# Patient Record
Sex: Female | Born: 1965 | Race: White | Hispanic: No | Marital: Married | Smoking: Never smoker
Health system: Southern US, Community
[De-identification: ages and names within clinical notes are randomized; demographics above are authoritative.]

## PROBLEM LIST (undated history)

## (undated) DIAGNOSIS — F329 Major depressive disorder, single episode, unspecified: Secondary | ICD-10-CM

## (undated) DIAGNOSIS — F32A Depression, unspecified: Secondary | ICD-10-CM

## (undated) DIAGNOSIS — E079 Disorder of thyroid, unspecified: Secondary | ICD-10-CM

## (undated) DIAGNOSIS — F419 Anxiety disorder, unspecified: Secondary | ICD-10-CM

## (undated) HISTORY — PX: PLACEMENT OF BREAST IMPLANTS: SHX6334

---
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
## 2017-01-24 ENCOUNTER — Encounter: Payer: Self-pay | Admitting: *Deleted

## 2017-01-24 ENCOUNTER — Ambulatory Visit
Admission: EM | Admit: 2017-01-24 | Discharge: 2017-01-24 | Disposition: A | Discharge status: Home / Self Care | Payer: BLUE CROSS/BLUE SHIELD | Attending: Family Medicine | Admitting: Family Medicine

## 2017-01-24 DIAGNOSIS — R05 Cough: Secondary | ICD-10-CM

## 2017-01-24 DIAGNOSIS — R059 Cough, unspecified: Secondary | ICD-10-CM

## 2017-01-24 DIAGNOSIS — J069 Acute upper respiratory infection, unspecified: Secondary | ICD-10-CM

## 2017-01-24 HISTORY — DX: Disorder of thyroid, unspecified: E07.9

## 2017-01-24 LAB — RAPID STREP SCREEN (MED CTR MEBANE ONLY): Streptococcus, Group A Screen (Direct): NEGATIVE

## 2017-01-24 MED ORDER — PREDNISONE 20 MG PO TABS: ORAL_TABLET | ORAL | 0 refills | Status: DC

## 2017-01-24 MED ORDER — HYDROCOD POLST-CPM POLST ER 10-8 MG/5ML PO SUER: 5.0000 mL | Freq: Two times a day (BID) | ORAL | 0 refills | Status: DC

## 2017-01-24 MED ORDER — BENZONATATE 200 MG PO CAPS: ORAL_CAPSULE | ORAL | 0 refills | Status: DC

## 2017-01-24 NOTE — ED Triage Notes (Signed)
Sore throat and non-productive cough x1 week. Denies other symptoms.

## 2017-01-24 NOTE — ED Provider Notes (Signed)
CSN: [id number]     Arrival date & time 01/24/17  1111 History   First MD Initiated Contact with Patient 01/24/17 1148     Chief Complaint  Patient presents with  . Sore Throat  . 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 Denies any nasal discharge has had no fever or chills. She's a nonsmoker. She is a traveling nurse in surgery. He first noticed a sore throat after she was trimming trees around her yard. At first she thought it was allergies but has persisted and worsened.      Past Medical History:  Diagnosis Date  . Thyroid disease    History reviewed. No pertinent surgical history. Family History  Problem Relation Age of Onset  . Alzheimer's disease Mother   . Healthy Father    Social History  Substance Use Topics  . Smoking status: Never Smoker  . Smokeless tobacco: Never Used  . Alcohol use Yes   OB History    No data available     Review of Systems  Constitutional: Negative for activity change, appetite change, chills, fatigue and fever.  HENT: Positive for sore throat.   Respiratory: Positive for cough. Negative for shortness of breath and stridor.   All other systems reviewed and are negative.   Allergies  Patient has no known allergies.  Home Medications   Prior to Admission medications   Medication Sig Start Date End Date Taking? Authorizing Provider  cyanocobalamin 100 MCG tablet Take 100 mcg by mouth daily.   Yes [provider]  escitalopram (LEXAPRO) 20 MG tablet Take 20 mg by mouth daily.   Yes [provider]  progesterone (PROMETRIUM) 100 MG capsule Take 100 mg by mouth daily.   Yes [provider]  thyroid (ARMOUR THYROID) 15 MG tablet Take 15 mg by mouth daily.   Yes [provider]  zolpidem (AMBIEN) 10 MG tablet Take 10 mg by mouth at bedtime  as needed for sleep.   Yes [provider]  benzonatate (TESSALON) 200 MG capsule Take one cap TID PRN cough 01/24/17   Lutricia Feiloemer, William P, PA-C  chlorpheniramine-HYDROcodone [facility](TUSSIONEX PENNKINETIC ER) 10-8 MG/5ML SUER Take 5 mLs by mouth 2 (two) times daily. 01/24/17   Lutricia Feiloemer, William P, PA-C  predniSONE (DELTASONE) 20 MG tablet Take 2 tablets (40 mg) daily by mouth 01/24/17   Lutricia Feiloemer, William P, PA-C   Meds Ordered and Administered this Visit  Medications - No data to display  BP 118/80 (BP Location: Left Arm)   Pulse 80   Temp 98.3 F (36.8 C) (Oral)   Resp 16   Ht 5\' 2"  (1.575 m)   Wt 127 lb (57.6 kg)   LMP  (Exact Date)   SpO2 99%   BMI 23.23 kg/m  No data found.   Physical Exam  Constitutional: She is oriented to person, place, and time. She appears well-developed and well-nourished. No distress.  HENT:  Head: Normocephalic.  Right Ear: External ear normal.  Left Ear: External ear normal.  Nose: Nose normal.  Mouth/Throat: Oropharynx is clear and moist. No oropharyngeal exudate.  Eyes: Pupils are equal, round, and reactive to light. Right eye exhibits no discharge. Left eye exhibits no discharge.  Neck: Normal range of motion.  Pulmonary/Chest: Effort normal and breath sounds normal.  Musculoskeletal: Normal range of motion.  Lymphadenopathy:    She has no cervical adenopathy.  Neurological: She is alert and oriented to person, place, and time.  Skin: Skin is warm and dry. She is not diaphoretic.  Psychiatric: She has a normal mood and affect. Her behavior is normal. Judgment and thought content normal.  Nursing note and vitals reviewed.   Urgent Care Course     Procedures (including critical care time)  Labs Review Labs Reviewed  RAPID STREP SCREEN (NOT AT [facility])  CULTURE, GROUP A STREP [facility])    Imaging Review No results found.   Visual Acuity Review  Right Eye Distance:   Left Eye Distance:   Bilateral Distance:    Right Eye Near:   Left Eye  Near:    Bilateral Near:         MDM   1. Cough   2. Upper respiratory tract infection, unspecified type    Discharge Medication List as of 01/24/2017 12:15 PM    START taking these medications   Details  benzonatate (TESSALON) 200 MG capsule Take one cap TID PRN cough, Normal    chlorpheniramine-HYDROcodone (TUSSIONEX PENNKINETIC ER) 10-8 MG/5ML SUER Take 5 mLs by mouth 2 (two) times daily., Starting Thu 01/24/2017, Print    predniSONE (DELTASONE) 20 MG tablet Take 2 tablets (40 mg) daily by mouth, Normal      Plan: 1. Test/x-ray results and diagnosis reviewed with patient 2. rx as per orders; risks, benefits, potential side effects reviewed with patient 3. Recommend supportive treatment with Rest and fluids. Use warm salt water gargles and lozenges for comfort. Through this is likely a viral illness and does not require antibiotic. Throat cultures will be available in 48 hours and if they do reveal a strep infection will be treated appropriately. She is worsening or not improving should follow-up with her primary care physician. 4. F/u prn if symptoms worsen or don't improve     Lutricia Feil, PA-C 01/24/17 1224

## 2017-01-26 LAB — CULTURE, GROUP A STREP (THRC)

## 2017-10-07 DIAGNOSIS — Z0000 Encounter for general adult medical examination without abnormal findings: Secondary | ICD-10-CM | POA: Diagnosis not present

## 2017-10-07 DIAGNOSIS — Z0001 Encounter for general adult medical examination with abnormal findings: Secondary | ICD-10-CM | POA: Diagnosis not present

## 2017-10-07 MED FILL — ZOLPIDEM TARTRATE 10 MG TAB: 10 | 90 days supply | Qty: 90 | Fill #0

## 2018-01-03 MED FILL — ZOLPIDEM TARTRATE 10 MG TAB: 10 | 90 days supply | Qty: 90 | Fill #1

## 2018-08-29 ENCOUNTER — Encounter (HOSPITAL_COMMUNITY): Payer: Self-pay | Admitting: Emergency Medicine

## 2018-08-29 ENCOUNTER — Emergency Department (HOSPITAL_COMMUNITY): Payer: BLUE CROSS/BLUE SHIELD

## 2018-08-29 ENCOUNTER — Emergency Department (HOSPITAL_COMMUNITY)
Admission: EM | Admit: 2018-08-29 | Discharge: 2018-08-29 | Disposition: A | Discharge status: Home / Self Care | Payer: BLUE CROSS/BLUE SHIELD | Attending: Emergency Medicine | Admitting: Emergency Medicine

## 2018-08-29 DIAGNOSIS — Z79899 Other long term (current) drug therapy: Secondary | ICD-10-CM | POA: Diagnosis not present

## 2018-08-29 DIAGNOSIS — R05 Cough: Secondary | ICD-10-CM | POA: Diagnosis present

## 2018-08-29 DIAGNOSIS — J111 Influenza due to unidentified influenza virus with other respiratory manifestations: Secondary | ICD-10-CM | POA: Insufficient documentation

## 2018-08-29 DIAGNOSIS — R69 Illness, unspecified: Secondary | ICD-10-CM

## 2018-08-29 MED ORDER — HYDROCODONE-HOMATROPINE 5-1.5 MG/5ML PO SYRP: 5.0000 mL | ORAL_SOLUTION | Freq: Four times a day (QID) | ORAL | 0 refills | Status: DC | PRN

## 2018-08-29 NOTE — ED Triage Notes (Signed)
Pt to ER for evaluation of flu like symptoms - cough (nonproductive), reports body aches, sore throat. Also reports generalized body aches. Reports no BM in 4 days. Reports sharp chest pain as well. A/o x4.

## 2018-08-29 NOTE — Discharge Instructions (Addendum)
Return if any problems.

## 2018-08-29 NOTE — ED Provider Notes (Signed)
[facility]ENC[facility] Provider Note   CSN: [id number] Arrival date & time: 08/29/18  1139     History   Chief Complaint Chief Complaint  Patient presents with  . Influenza    HPI Ninetta LightsBeverly Hussar is a 53 y.o. female.  The history is provided by the patient.  Cough  Cough characteristics:  Productive Sputum characteristics:  Green Severity:  Moderate Onset quality:  Gradual Duration:  4 days Timing:  Constant Progression:  Worsening Chronicity:  New Context: upper respiratory infection   Relieved by:  Nothing Worsened by:  Nothing Ineffective treatments:  None tried Associated symptoms: no chest pain and no shortness of breath   Pt complains of abdominal pain from coughing.  Pt reports she she started taking tamiflu 2 days ago.  Pt reports no bowel movement in 4 days.   Past Medical History:  Diagnosis Date  . Thyroid disease     There are no active problems to display for this patient.   History reviewed. No pertinent surgical history.   OB History   No obstetric history on file.      Home Medications    Prior to Admission medications   Medication Sig Start Date End Date Taking? Authorizing Provider  benzonatate (TESSALON) 200 MG capsule Take one cap TID PRN cough 01/24/17   Lutricia Feiloemer, William P, PA-C  chlorpheniramine-HYDROcodone [facility](TUSSIONEX PENNKINETIC ER) 10-8 MG/5ML SUER Take 5 mLs by mouth 2 (two) times daily. 01/24/17   Lutricia Feiloemer, William P, PA-C  cyanocobalamin 100 MCG tablet Take 100 mcg by mouth daily.    [provider]  escitalopram (LEXAPRO) 20 MG tablet Take 20 mg by mouth daily.    [provider]  predniSONE (DELTASONE) 20 MG tablet Take 2 tablets (40 mg) daily by mouth 01/24/17   Lutricia Feiloemer, William P, PA-C  progesterone (PROMETRIUM) 100 MG capsule Take 100 mg by mouth daily.    [provider]  thyroid (ARMOUR THYROID) 15 MG tablet Take 15 mg by mouth daily.    [provider]  zolpidem  (AMBIEN) 10 MG tablet Take 10 mg by mouth at bedtime as needed for sleep.    [provider]    Family History Family History  Problem Relation Age of Onset  . Alzheimer's disease Mother   . Healthy Father     Social History Social History   Tobacco Use  . Smoking status: Never Smoker  . Smokeless tobacco: Never Used  Substance Use Topics  . Alcohol use: Yes  . Drug use: No     Allergies   Patient has no known allergies.   Review of Systems Review of Systems  Respiratory: Positive for cough. Negative for shortness of breath.   Cardiovascular: Negative for chest pain.  All other systems reviewed and are negative.    Physical Exam Updated Vital Signs BP 125/79 (BP Location: Right Arm)   Pulse (!) 101   Temp 98.5 F (36.9 C) (Oral)   Resp 18   SpO2 99%   Physical Exam Vitals signs reviewed.  HENT:     Head: Normocephalic.     Right Ear: External ear normal.     Left Ear: External ear normal.     Mouth/Throat:     Mouth: Mucous membranes are moist.  Eyes:     Pupils: Pupils are equal, round, and reactive to light.  Neck:     Musculoskeletal: Normal range of motion.  Cardiovascular:     Rate and Rhythm: Normal rate.  Pulmonary:     Effort: Pulmonary effort is normal.  Abdominal:     General: Abdomen is flat.     Tenderness: There is no abdominal tenderness.  Musculoskeletal: Normal range of motion.  Skin:    General: Skin is warm.  Neurological:     General: No focal deficit present.     Mental Status: She is alert.  Psychiatric:        Mood and Affect: Mood normal.      ED Treatments / Results  Labs (all labs ordered are listed, but only abnormal results are displayed) Labs Reviewed - No data to display  EKG None  Radiology Dg Chest 2 View  Result Date: 08/29/2018 CLINICAL DATA:  Flu-like symptoms EXAM: CHEST - 2 VIEW COMPARISON:  None. FINDINGS: Normal heart size. Lungs clear. No pneumothorax. No pleural effusion. IMPRESSION:  No active cardiopulmonary disease. Electronically Signed   By: Jolaine Click M.D.   On: 08/29/2018 13:09    Procedures Procedures (including critical care time)  Medications Ordered in ED Medications - No data to display   Initial Impression / Assessment and Plan / ED Course  I have reviewed the triage vital signs and the nursing notes.  Pertinent labs & imaging results that were available during my care of the patient were reviewed by me and considered in my medical decision making (see chart for details).     MDM  Chest xray negative,   Pt advised to continue tamiflu,  Pt given rx for Hycodan for cough.  Pt is taking ibuprofen.  Pt encouraged to drink plenty of fluids.  Return if symptoms worsen or change   Final Clinical Impressions(s) / ED Diagnoses   Final diagnoses:  Influenza-like illness    ED Discharge Orders         Ordered    HYDROcodone-homatropine (HYCODAN) 5-1.5 MG/5ML syrup  Every 6 hours PRN     08/29/18 1405        An After Visit Summary was printed and given to the patient.    Elson Areas, Cordelia Poche 08/29/18 1442    Jacalyn Lefevre, MD 08/29/18 [phone]

## 2019-02-13 ENCOUNTER — Other Ambulatory Visit: Payer: Self-pay | Admitting: Orthopedic Surgery

## 2019-02-20 NOTE — Pre-Procedure Instructions (Signed)
[id number] - [place], [address] [place] [address] Phone: [phone] Fax: [phone]  [id number] - [place], [address] [place] [address] Phone: [phone] Fax: [phone]      Your procedure is scheduled on Wednesday July 22nd.  Report to [facility] Main Entrance "A" at 7:30 A.M., and check in at the Admitting office.  Call this number if you have problems the morning of surgery:  [phone]  Call [phone] if you have any questions prior to your surgery date Monday-Friday 8am-4pm    Remember:   You may drink clear liquids until 7:30 the morning of your surgery.   Clear liquids allowed are: Water, Non-Citrus Juices (without pulp), Carbonated Beverages, Clear Tea, Black Coffee Only, and Gatorade. Your surgeon as prescribed a Pre-Surgery Ensure for you to drink the morning of your surgery. Please make sure you complete this by 7:30 AM the morning of your surgery. Drink all in one sitting, do not sip.    Take these medicines the morning of surgery with A SIP OF WATER  escitalopram (LEXAPRO) thyroid (ARMOUR THYROID)    As of today, STOP taking any Aspirin (unless otherwise instructed by your surgeon), Aleve, Naproxen, Ibuprofen, Motrin, Advil, Goody's, BC's, all herbal medications, fish oil, and all vitamins.    The Morning of Surgery  Do not wear jewelry, make-up or nail polish.  Do not wear lotions, powders, or perfumes/colognes, or deodorant  Do not shave 48 hours prior to surgery.  Men may shave face and neck.  Do not bring valuables to the hospital.  [facility] is not responsible for any belongings or valuables.  If you are a smoker, DO NOT Smoke 24 hours prior to surgery IF you wear a CPAP at night please bring your mask, tubing, and machine the morning of surgery   Remember that you must have someone to transport you home after your surgery, and remain with you for 24 hours if  you are discharged the same day.   Contacts, glasses, hearing aids, dentures or bridgework may not be worn into surgery.    Leave your suitcase in the car.  After surgery it may be brought to your room.  For patients admitted to the hospital, discharge time will be determined by your treatment team.  Patients discharged the day of surgery will not be allowed to drive home.    Special instructions:   [facility]- Preparing For Surgery  Before surgery, you can play an important role. Because skin is not sterile, your skin needs to be as free of germs as possible. You can reduce the number of germs on your skin by washing with CHG (chlorahexidine gluconate) Soap before surgery.  CHG is an antiseptic cleaner which kills germs and bonds with the skin to continue killing germs even after washing.    Oral Hygiene is also important to reduce your risk of infection.  Remember - BRUSH YOUR TEETH THE MORNING OF SURGERY WITH YOUR REGULAR TOOTHPASTE  Please do not use if you have an allergy to CHG or antibacterial soaps. If your skin becomes reddened/irritated stop using the CHG.  Do not shave (including legs and underarms) for at least 48 hours prior to first CHG shower. It is OK to shave your face.  Please follow these instructions carefully.   1. Shower the NIGHT BEFORE SURGERY and the MORNING OF SURGERY with CHG Soap.   2. If you  chose to wash your hair, wash your hair first as usual with your normal shampoo.  3. After you shampoo, rinse your hair and body thoroughly to remove the shampoo.  4. Use CHG as you would any other liquid soap. You can apply CHG directly to the skin and wash gently with a scrungie or a clean washcloth.   5. Apply the CHG Soap to your body ONLY FROM THE NECK DOWN.  Do not use on open wounds or open sores. Avoid contact with your eyes, ears, mouth and genitals (private parts). Wash Face and genitals (private parts)  with your normal soap.   6. Wash thoroughly, paying  special attention to the area where your surgery will be performed.  7. Thoroughly rinse your body with warm water from the neck down.  8. DO NOT shower/wash with your normal soap after using and rinsing off the CHG Soap.  9. Pat yourself dry with a CLEAN TOWEL.  10. Wear CLEAN PAJAMAS to bed the night before surgery, wear comfortable clothes the morning of surgery  11. Place CLEAN SHEETS on your bed the night of your first shower and DO NOT SLEEP WITH PETS.    Day of Surgery:  Do not apply any deodorants/lotions. Please shower the morning of surgery with the CHG soap  Please wear clean clothes to the hospital/surgery center.   Remember to brush your teeth WITH YOUR REGULAR TOOTHPASTE.   Please read over the following fact sheets that you were given.

## 2019-02-23 ENCOUNTER — Other Ambulatory Visit: Payer: Self-pay

## 2019-02-23 ENCOUNTER — Encounter (HOSPITAL_COMMUNITY): Payer: Self-pay

## 2019-02-23 ENCOUNTER — Encounter (HOSPITAL_COMMUNITY)
Admission: RE | Admit: 2019-02-23 | Discharge: 2019-02-23 | Disposition: A | Discharge status: Home / Self Care | Payer: BC Managed Care – PPO | Source: Ambulatory Visit | Attending: Orthopedic Surgery | Admitting: Orthopedic Surgery

## 2019-02-23 ENCOUNTER — Other Ambulatory Visit (HOSPITAL_COMMUNITY)
Admission: RE | Admit: 2019-02-23 | Discharge: 2019-02-23 | Disposition: A | Discharge status: Home / Self Care | Payer: BC Managed Care – PPO | Source: Ambulatory Visit | Attending: Orthopedic Surgery | Admitting: Orthopedic Surgery

## 2019-02-23 DIAGNOSIS — Z01812 Encounter for preprocedural laboratory examination: Secondary | ICD-10-CM | POA: Diagnosis not present

## 2019-02-23 DIAGNOSIS — Z20828 Contact with and (suspected) exposure to other viral communicable diseases: Secondary | ICD-10-CM | POA: Diagnosis not present

## 2019-02-23 HISTORY — DX: Depression, unspecified: F32.A

## 2019-02-23 HISTORY — DX: Anxiety disorder, unspecified: F41.9

## 2019-02-23 LAB — CBC WITH DIFFERENTIAL/PLATELET
Abs Immature Granulocytes: 0.01 10*3/uL (ref 0.00–0.07)
Basophils Absolute: 0 10*3/uL (ref 0.0–0.1)
Basophils Relative: 1 %
Eosinophils Absolute: 0.1 10*3/uL (ref 0.0–0.5)
Eosinophils Relative: 3 %
HCT: 41.4 % (ref 36.0–46.0)
Hemoglobin: 13.6 g/dL (ref 12.0–15.0)
Immature Granulocytes: 0 %
Lymphocytes Relative: 37 %
Lymphs Abs: 1.8 10*3/uL (ref 0.7–4.0)
MCH: 32 pg (ref 26.0–34.0)
MCHC: 32.9 g/dL (ref 30.0–36.0)
MCV: 97.4 fL (ref 80.0–100.0)
Monocytes Absolute: 0.5 10*3/uL (ref 0.1–1.0)
Monocytes Relative: 10 %
Neutro Abs: 2.4 10*3/uL (ref 1.7–7.7)
Neutrophils Relative %: 49 %
Platelets: 390 10*3/uL (ref 150–400)
RBC: 4.25 MIL/uL (ref 3.87–5.11)
RDW: 11.4 % — ABNORMAL LOW (ref 11.5–15.5)
WBC: 4.9 10*3/uL (ref 4.0–10.5)
nRBC: 0 % (ref 0.0–0.2)

## 2019-02-23 LAB — TYPE AND SCREEN
ABO/RH(D): A NEG
Antibody Screen: NEGATIVE

## 2019-02-23 LAB — COMPREHENSIVE METABOLIC PANEL
ALT: 47 U/L — ABNORMAL HIGH (ref 0–44)
AST: 34 U/L (ref 15–41)
Albumin: 4.4 g/dL (ref 3.5–5.0)
Alkaline Phosphatase: 64 U/L (ref 38–126)
Anion gap: 9 (ref 5–15)
BUN: 21 mg/dL — ABNORMAL HIGH (ref 6–20)
CO2: 28 mmol/L (ref 22–32)
Calcium: 9.5 mg/dL (ref 8.9–10.3)
Chloride: 98 mmol/L (ref 98–111)
Creatinine, Ser: 0.63 mg/dL (ref 0.44–1.00)
GFR calc Af Amer: >60 mL/min (ref >60)
GFR calc non Af Amer: >60 mL/min (ref >60)
Glucose, Bld: 97 mg/dL (ref 70–99)
Potassium: 3.8 mmol/L (ref 3.5–5.1)
Sodium: 135 mmol/L (ref 135–145)
Total Bilirubin: 0.3 mg/dL (ref 0.3–1.2)
Total Protein: 6.9 g/dL (ref 6.5–8.1)

## 2019-02-23 LAB — URINALYSIS, ROUTINE W REFLEX MICROSCOPIC
Bilirubin Urine: NEGATIVE
Glucose, UA: NEGATIVE mg/dL
Ketones, ur: NEGATIVE mg/dL
Leukocytes,Ua: NEGATIVE
Nitrite: NEGATIVE
Protein, ur: NEGATIVE mg/dL
Specific Gravity, Urine: 1.018 (ref 1.005–1.030)
pH: 6 (ref 5.0–8.0)

## 2019-02-23 LAB — SARS CORONAVIRUS 2 (TAT 6-24 HRS): SARS Coronavirus 2: NEGATIVE

## 2019-02-23 LAB — APTT: aPTT: 27 seconds (ref 24–36)

## 2019-02-23 LAB — ABO/RH: ABO/RH(D): A NEG

## 2019-02-23 LAB — SURGICAL PCR SCREEN
MRSA, PCR: NEGATIVE
Staphylococcus aureus: POSITIVE — AB

## 2019-02-23 LAB — PROTIME-INR
INR: 1 (ref 0.8–1.2)
Prothrombin Time: 13.4 seconds (ref 11.4–15.2)

## 2019-02-23 NOTE — Progress Notes (Addendum)
PCP - Erskine Speed, MD in [facility] Cardiologist - denies  Chest x-ray - 08/29/2018 in [facility] EKG - 08/30/2018 in [facility]  Stress Test - denies ECHO - denies  Cardiac Cath - denies  Sleep Study - denies CPAP - n/a  Fasting Blood Sugar - n/a Checks Blood Sugar _____ times a day-n/a  Blood Thinner Instructions: n/a Aspirin Instructions: n/a  Anesthesia review: no  Patient denies shortness of breath, fever, cough and chest pain at PAT appointment  Patient verbalized understanding of instructions that were given to them at the PAT appointment. Patient was also instructed that they will need to review over the PAT instructions again at home before surgery.   Coronavirus Screening  Have you experienced the following symptoms:  Cough yes/no: No Fever (>100.16F)  yes/no: No Runny nose yes/no: No Sore throat yes/no: No Difficulty breathing/shortness of breath  yes/no: No  Have you or a family member traveled in the last 14 days and where? yes/no: No   If the patient is not experiencing any of these symptoms, the PAT nurse will instruct them to NOT bring anyone with them to their appointment since they may have these symptoms or traveled as well.   Please remind your patients and families that hospital visitation restrictions are in effect and the importance of the restrictions.

## 2019-02-25 ENCOUNTER — Other Ambulatory Visit: Payer: Self-pay

## 2019-02-25 ENCOUNTER — Inpatient Hospital Stay (HOSPITAL_COMMUNITY)
Admission: RE | Disposition: A | Discharge status: Home / Self Care | Payer: Self-pay | Source: Home / Self Care | Attending: Orthopedic Surgery

## 2019-02-25 ENCOUNTER — Inpatient Hospital Stay (HOSPITAL_COMMUNITY): Payer: BC Managed Care – PPO | Admitting: Vascular Surgery

## 2019-02-25 ENCOUNTER — Inpatient Hospital Stay (HOSPITAL_COMMUNITY): Payer: BC Managed Care – PPO | Admitting: Anesthesiology

## 2019-02-25 ENCOUNTER — Inpatient Hospital Stay (HOSPITAL_COMMUNITY)
Admission: RE | Admit: 2019-02-25 | Discharge: 2019-02-26 | DRG: 455 | Disposition: A | Discharge status: Home / Self Care | Payer: BC Managed Care – PPO | Attending: Orthopedic Surgery | Admitting: Orthopedic Surgery

## 2019-02-25 ENCOUNTER — Encounter (HOSPITAL_COMMUNITY): Payer: Self-pay

## 2019-02-25 ENCOUNTER — Inpatient Hospital Stay (HOSPITAL_COMMUNITY): Payer: BC Managed Care – PPO

## 2019-02-25 DIAGNOSIS — Z79890 Hormone replacement therapy (postmenopausal): Secondary | ICD-10-CM | POA: Diagnosis not present

## 2019-02-25 DIAGNOSIS — G8918 Other acute postprocedural pain: Secondary | ICD-10-CM | POA: Diagnosis present

## 2019-02-25 DIAGNOSIS — M4317 Spondylolisthesis, lumbosacral region: Secondary | ICD-10-CM | POA: Diagnosis present

## 2019-02-25 DIAGNOSIS — Z79891 Long term (current) use of opiate analgesic: Secondary | ICD-10-CM | POA: Diagnosis not present

## 2019-02-25 DIAGNOSIS — M4316 Spondylolisthesis, lumbar region: Secondary | ICD-10-CM | POA: Diagnosis present

## 2019-02-25 DIAGNOSIS — M48061 Spinal stenosis, lumbar region without neurogenic claudication: Secondary | ICD-10-CM | POA: Diagnosis present

## 2019-02-25 DIAGNOSIS — F419 Anxiety disorder, unspecified: Secondary | ICD-10-CM | POA: Diagnosis present

## 2019-02-25 DIAGNOSIS — E079 Disorder of thyroid, unspecified: Secondary | ICD-10-CM | POA: Diagnosis present

## 2019-02-25 DIAGNOSIS — M5116 Intervertebral disc disorders with radiculopathy, lumbar region: Secondary | ICD-10-CM | POA: Diagnosis present

## 2019-02-25 DIAGNOSIS — F329 Major depressive disorder, single episode, unspecified: Secondary | ICD-10-CM | POA: Diagnosis present

## 2019-02-25 DIAGNOSIS — Z79899 Other long term (current) drug therapy: Secondary | ICD-10-CM | POA: Diagnosis not present

## 2019-02-25 DIAGNOSIS — M5410 Radiculopathy, site unspecified: Secondary | ICD-10-CM

## 2019-02-25 DIAGNOSIS — Z419 Encounter for procedure for purposes other than remedying health state, unspecified: Secondary | ICD-10-CM

## 2019-02-25 HISTORY — PX: TRANSFORAMINAL LUMBAR INTERBODY FUSION (TLIF) WITH PEDICLE SCREW FIXATION 2 LEVEL: SHX6142

## 2019-02-25 SURGERY — TRANSFORAMINAL LUMBAR INTERBODY FUSION (TLIF) WITH PEDICLE SCREW FIXATION 2 LEVEL
Anesthesia: General | Laterality: Left

## 2019-02-25 MED ORDER — PHENYLEPHRINE 40 MCG/ML (10ML) SYRINGE FOR IV PUSH (FOR BLOOD PRESSURE SUPPORT)
PREFILLED_SYRINGE | INTRAVENOUS | Status: AC
  Filled 2019-02-25: qty 10

## 2019-02-25 MED ORDER — BUPIVACAINE-EPINEPHRINE (PF) 0.25% -1:200000 IJ SOLN
INTRAMUSCULAR | Status: AC
  Filled 2019-02-25: qty 30

## 2019-02-25 MED ORDER — PROPOFOL 10 MG/ML IV BOLUS
INTRAVENOUS | Status: AC
  Filled 2019-02-25: qty 20

## 2019-02-25 MED ORDER — SODIUM CHLORIDE 0.9 % IV SOLN
INTRAVENOUS | Status: DC | PRN
  Administered 2019-02-25: 15:00:00 via INTRAVENOUS

## 2019-02-25 MED ORDER — LIDOCAINE 2% (20 MG/ML) 5 ML SYRINGE
INTRAMUSCULAR | Status: DC | PRN
  Administered 2019-02-25: 40 mg via INTRAVENOUS

## 2019-02-25 MED ORDER — ONDANSETRON HCL 4 MG/2ML IJ SOLN
INTRAMUSCULAR | Status: AC
  Filled 2019-02-25: qty 2

## 2019-02-25 MED ORDER — DEXMEDETOMIDINE HCL 200 MCG/2ML IV SOLN
INTRAVENOUS | Status: DC | PRN
  Administered 2019-02-25 (×4): 8 ug via INTRAVENOUS

## 2019-02-25 MED ORDER — SODIUM CHLORIDE 0.9 % IV SOLN: 250.0000 mL | INTRAVENOUS | Status: DC

## 2019-02-25 MED ORDER — DIAZEPAM 5 MG PO TABS
5.0000 mg | ORAL_TABLET | Freq: Four times a day (QID) | ORAL | Status: DC | PRN
  Administered 2019-02-25 – 2019-02-26 (×3): 5 mg via ORAL
  Filled 2019-02-25 (×3): qty 1

## 2019-02-25 MED ORDER — DEXAMETHASONE SODIUM PHOSPHATE 10 MG/ML IJ SOLN
INTRAMUSCULAR | Status: DC | PRN
  Administered 2019-02-25: 10 mg via INTRAVENOUS

## 2019-02-25 MED ORDER — OXYCODONE HCL 5 MG/5ML PO SOLN: 5.0000 mg | Freq: Once | ORAL | Status: AC | PRN

## 2019-02-25 MED ORDER — ACETAMINOPHEN 10 MG/ML IV SOLN
1000.0000 mg | Freq: Once | INTRAVENOUS | Status: AC
  Administered 2019-02-25: 1000 mg via INTRAVENOUS

## 2019-02-25 MED ORDER — ONDANSETRON HCL 4 MG/2ML IJ SOLN: 4.0000 mg | Freq: Four times a day (QID) | INTRAMUSCULAR | Status: DC | PRN

## 2019-02-25 MED ORDER — ALUM & MAG HYDROXIDE-SIMETH 200-200-20 MG/5ML PO SUSP: 30.0000 mL | Freq: Four times a day (QID) | ORAL | Status: DC | PRN

## 2019-02-25 MED ORDER — ROCURONIUM BROMIDE 10 MG/ML (PF) SYRINGE
PREFILLED_SYRINGE | INTRAVENOUS | Status: AC
  Filled 2019-02-25: qty 10

## 2019-02-25 MED ORDER — METHYLENE BLUE 0.5 % INJ SOLN
INTRAVENOUS | Status: AC
  Filled 2019-02-25: qty 10

## 2019-02-25 MED ORDER — MORPHINE SULFATE (PF) 2 MG/ML IV SOLN: 1.0000 mg | INTRAVENOUS | Status: DC | PRN

## 2019-02-25 MED ORDER — MIDAZOLAM HCL 5 MG/5ML IJ SOLN
INTRAMUSCULAR | Status: DC | PRN
  Administered 2019-02-25 (×2): 1 mg via INTRAVENOUS

## 2019-02-25 MED ORDER — ACETAMINOPHEN 10 MG/ML IV SOLN
INTRAVENOUS | Status: AC
  Administered 2019-02-25: 18:00:00 1000 mg via INTRAVENOUS
  Filled 2019-02-25: qty 100

## 2019-02-25 MED ORDER — ACETAMINOPHEN 325 MG PO TABS: 650.0000 mg | ORAL_TABLET | ORAL | Status: DC | PRN

## 2019-02-25 MED ORDER — THROMBIN 20000 UNITS EX SOLR
CUTANEOUS | Status: DC | PRN
  Administered 2019-02-25: 12:00:00 20000 [IU] via TOPICAL

## 2019-02-25 MED ORDER — BUPIVACAINE-EPINEPHRINE 0.25% -1:200000 IJ SOLN
INTRAMUSCULAR | Status: DC | PRN
  Administered 2019-02-25: 30 mL

## 2019-02-25 MED ORDER — MENTHOL 3 MG MT LOZG
1.0000 | LOZENGE | OROMUCOSAL | Status: DC | PRN
  Filled 2019-02-25 (×2): qty 9

## 2019-02-25 MED ORDER — PROPOFOL 500 MG/50ML IV EMUL
INTRAVENOUS | Status: DC | PRN
  Administered 2019-02-25: 30 ug/kg/min via INTRAVENOUS
  Administered 2019-02-25: 15:00:00 via INTRAVENOUS

## 2019-02-25 MED ORDER — SODIUM CHLORIDE 0.9% FLUSH: 3.0000 mL | Freq: Two times a day (BID) | INTRAVENOUS | Status: DC

## 2019-02-25 MED ORDER — CEFAZOLIN SODIUM-DEXTROSE 2-4 GM/100ML-% IV SOLN
2.0000 g | INTRAVENOUS | Status: AC
  Administered 2019-02-25 (×2): 2 g via INTRAVENOUS
  Filled 2019-02-25: qty 100

## 2019-02-25 MED ORDER — ALBUMIN HUMAN 5 % IV SOLN
INTRAVENOUS | Status: DC | PRN
  Administered 2019-02-25 (×2): via INTRAVENOUS

## 2019-02-25 MED ORDER — POVIDONE-IODINE 7.5 % EX SOLN
Freq: Once | CUTANEOUS | Status: DC
  Filled 2019-02-25: qty 118

## 2019-02-25 MED ORDER — FENTANYL CITRATE (PF) 100 MCG/2ML IJ SOLN
25.0000 ug | INTRAMUSCULAR | Status: DC | PRN
  Administered 2019-02-25: 17:00:00 50 ug via INTRAVENOUS

## 2019-02-25 MED ORDER — ACETAMINOPHEN 650 MG RE SUPP: 650.0000 mg | RECTAL | Status: DC | PRN

## 2019-02-25 MED ORDER — MIDAZOLAM HCL 2 MG/2ML IJ SOLN
INTRAMUSCULAR | Status: AC
  Filled 2019-02-25: qty 2

## 2019-02-25 MED ORDER — POTASSIUM CHLORIDE IN NACL 20-0.9 MEQ/L-% IV SOLN: INTRAVENOUS | Status: DC

## 2019-02-25 MED ORDER — ZOLPIDEM TARTRATE 5 MG PO TABS: 5.0000 mg | ORAL_TABLET | Freq: Every evening | ORAL | Status: DC | PRN

## 2019-02-25 MED ORDER — CEFAZOLIN SODIUM-DEXTROSE 2-4 GM/100ML-% IV SOLN
2.0000 g | Freq: Three times a day (TID) | INTRAVENOUS | Status: AC
  Administered 2019-02-25 – 2019-02-26 (×2): 2 g via INTRAVENOUS
  Filled 2019-02-25 (×2): qty 100

## 2019-02-25 MED ORDER — FENTANYL CITRATE (PF) 250 MCG/5ML IJ SOLN
INTRAMUSCULAR | Status: AC
  Filled 2019-02-25: qty 5

## 2019-02-25 MED ORDER — OXYCODONE HCL 5 MG PO TABS
5.0000 mg | ORAL_TABLET | Freq: Once | ORAL | Status: AC | PRN
  Administered 2019-02-25: 5 mg via ORAL

## 2019-02-25 MED ORDER — FENTANYL CITRATE (PF) 250 MCG/5ML IJ SOLN
INTRAMUSCULAR | Status: DC | PRN
  Administered 2019-02-25 (×10): 50 ug via INTRAVENOUS

## 2019-02-25 MED ORDER — LACTATED RINGERS IV SOLN
INTRAVENOUS | Status: DC
  Administered 2019-02-25: 08:00:00 via INTRAVENOUS

## 2019-02-25 MED ORDER — SODIUM CHLORIDE (PF) 0.9 % IJ SOLN
INTRAMUSCULAR | Status: AC
  Filled 2019-02-25: qty 10

## 2019-02-25 MED ORDER — ESCITALOPRAM OXALATE 20 MG PO TABS
20.0000 mg | ORAL_TABLET | Freq: Every day | ORAL | Status: DC
  Administered 2019-02-26: 20 mg via ORAL
  Filled 2019-02-25 (×2): qty 1

## 2019-02-25 MED ORDER — PROPOFOL 10 MG/ML IV BOLUS
INTRAVENOUS | Status: DC | PRN
  Administered 2019-02-25: 150 mg via INTRAVENOUS

## 2019-02-25 MED ORDER — HEMOSTATIC AGENTS (NO CHARGE) OPTIME
TOPICAL | Status: DC | PRN
  Administered 2019-02-25: 1 via TOPICAL

## 2019-02-25 MED ORDER — SENNOSIDES-DOCUSATE SODIUM 8.6-50 MG PO TABS: 1.0000 | ORAL_TABLET | Freq: Every evening | ORAL | Status: DC | PRN

## 2019-02-25 MED ORDER — OXYCODONE-ACETAMINOPHEN 5-325 MG PO TABS
1.0000 | ORAL_TABLET | ORAL | Status: DC | PRN
  Administered 2019-02-25 – 2019-02-26 (×2): 2 via ORAL
  Filled 2019-02-25 (×2): qty 2

## 2019-02-25 MED ORDER — BUPIVACAINE LIPOSOME 1.3 % IJ SUSP
INTRAMUSCULAR | Status: DC | PRN
  Administered 2019-02-25: 20 mL

## 2019-02-25 MED ORDER — LIDOCAINE 2% (20 MG/ML) 5 ML SYRINGE
INTRAMUSCULAR | Status: AC
  Filled 2019-02-25: qty 5

## 2019-02-25 MED ORDER — ZOLPIDEM TARTRATE 5 MG PO TABS: 10.0000 mg | ORAL_TABLET | Freq: Every evening | ORAL | Status: DC | PRN

## 2019-02-25 MED ORDER — DEXAMETHASONE SODIUM PHOSPHATE 10 MG/ML IJ SOLN
INTRAMUSCULAR | Status: AC
  Filled 2019-02-25: qty 1

## 2019-02-25 MED ORDER — STERILE WATER FOR IRRIGATION IR SOLN
Status: DC | PRN
  Administered 2019-02-25: 1000 mL

## 2019-02-25 MED ORDER — METHYLENE BLUE 0.5 % INJ SOLN
INTRAVENOUS | Status: DC | PRN
  Administered 2019-02-25: 1 mL

## 2019-02-25 MED ORDER — BISACODYL 5 MG PO TBEC: 5.0000 mg | DELAYED_RELEASE_TABLET | Freq: Every day | ORAL | Status: DC | PRN

## 2019-02-25 MED ORDER — ONDANSETRON HCL 4 MG PO TABS: 4.0000 mg | ORAL_TABLET | Freq: Four times a day (QID) | ORAL | Status: DC | PRN

## 2019-02-25 MED ORDER — PHENOL 1.4 % MT LIQD: 1.0000 | OROMUCOSAL | Status: DC | PRN

## 2019-02-25 MED ORDER — DOCUSATE SODIUM 100 MG PO CAPS
100.0000 mg | ORAL_CAPSULE | Freq: Two times a day (BID) | ORAL | Status: DC
  Administered 2019-02-25 – 2019-02-26 (×2): 100 mg via ORAL
  Filled 2019-02-25 (×2): qty 1

## 2019-02-25 MED ORDER — FENTANYL CITRATE (PF) 100 MCG/2ML IJ SOLN
INTRAMUSCULAR | Status: AC
  Administered 2019-02-25: 17:00:00 50 ug via INTRAVENOUS
  Filled 2019-02-25: qty 2

## 2019-02-25 MED ORDER — SODIUM CHLORIDE 0.9% FLUSH: 3.0000 mL | INTRAVENOUS | Status: DC | PRN

## 2019-02-25 MED ORDER — BUPIVACAINE LIPOSOME 1.3 % IJ SUSP
20.0000 mL | Freq: Once | INTRAMUSCULAR | Status: DC
  Filled 2019-02-25: qty 20

## 2019-02-25 MED ORDER — ROCURONIUM BROMIDE 10 MG/ML (PF) SYRINGE
PREFILLED_SYRINGE | INTRAVENOUS | Status: DC | PRN
  Administered 2019-02-25: 50 mg via INTRAVENOUS
  Administered 2019-02-25: 20 mg via INTRAVENOUS
  Administered 2019-02-25: 10 mg via INTRAVENOUS
  Administered 2019-02-25: 20 mg via INTRAVENOUS
  Administered 2019-02-25: 10 mg via INTRAVENOUS

## 2019-02-25 MED ORDER — SODIUM CHLORIDE 0.9 % IV SOLN
INTRAVENOUS | Status: DC | PRN
  Administered 2019-02-25: 11:00:00 10 ug/min via INTRAVENOUS

## 2019-02-25 MED ORDER — THYROID 120 MG PO TABS
120.0000 mg | ORAL_TABLET | Freq: Every day | ORAL | Status: DC
  Administered 2019-02-26: 120 mg via ORAL
  Filled 2019-02-25 (×2): qty 1

## 2019-02-25 MED ORDER — OXYCODONE HCL 5 MG PO TABS
ORAL_TABLET | ORAL | Status: AC
  Filled 2019-02-25: qty 1

## 2019-02-25 MED ORDER — PROPOFOL 1000 MG/100ML IV EMUL
INTRAVENOUS | Status: AC
  Filled 2019-02-25: qty 100

## 2019-02-25 MED ORDER — LACTATED RINGERS IV SOLN
INTRAVENOUS | Status: DC | PRN
  Administered 2019-02-25 (×2): via INTRAVENOUS

## 2019-02-25 MED ORDER — CEFAZOLIN SODIUM 1 G IJ SOLR
INTRAMUSCULAR | Status: AC
  Filled 2019-02-25: qty 20

## 2019-02-25 MED ORDER — 0.9 % SODIUM CHLORIDE (POUR BTL) OPTIME
TOPICAL | Status: DC | PRN
  Administered 2019-02-25 (×3): 1000 mL

## 2019-02-25 MED ORDER — ONDANSETRON HCL 4 MG/2ML IJ SOLN: 4.0000 mg | Freq: Once | INTRAMUSCULAR | Status: DC | PRN

## 2019-02-25 MED ORDER — FLEET ENEMA 7-19 GM/118ML RE ENEM: 1.0000 | ENEMA | Freq: Once | RECTAL | Status: DC | PRN

## 2019-02-25 MED ORDER — SUGAMMADEX SODIUM 200 MG/2ML IV SOLN
INTRAVENOUS | Status: DC | PRN
  Administered 2019-02-25: 116.2 mg via INTRAVENOUS

## 2019-02-25 MED ORDER — THROMBIN (RECOMBINANT) 20000 UNITS EX SOLR
CUTANEOUS | Status: AC
  Filled 2019-02-25: qty 20000

## 2019-02-25 MED ORDER — ONDANSETRON HCL 4 MG/2ML IJ SOLN
INTRAMUSCULAR | Status: DC | PRN
  Administered 2019-02-25: 4 mg via INTRAVENOUS

## 2019-02-25 SURGICAL SUPPLY — 97 items
BENZOIN TINCTURE PRP APPL 2/3 (GAUZE/BANDAGES/DRESSINGS) ×3 IMPLANT
BLADE CLIPPER SURG (BLADE) IMPLANT
BONE VIVIGEN FORMABLE 10CC (Bone Implant) ×3 IMPLANT
BUR PRESCISION 1.7 ELITE (BURR) ×3 IMPLANT
BUR ROUND FLUTED 5 RND (BURR) ×2 IMPLANT
BUR ROUND FLUTED 5MM RND (BURR) ×1
BUR ROUND PRECISION 4.0 (BURR) IMPLANT
BUR ROUND PRECISION 4.0MM (BURR)
BUR SABER RD CUTTING 3.0 (BURR) IMPLANT
BUR SABER RD CUTTING 3.0MM (BURR)
CAGE CONCORDE BULLET 9X8X23 (Cage) ×2 IMPLANT
CAGE CONCORDE BULLET 9X9X23 (Cage) ×2 IMPLANT
CAGE SPNL 5D BLT 23X9X9X (Cage) IMPLANT
CARTRIDGE OIL MAESTRO DRILL (MISCELLANEOUS) ×1 IMPLANT
CLOSURE WOUND 1/2 X4 (GAUZE/BANDAGES/DRESSINGS) ×2
CONT SPEC 4OZ CLIKSEAL STRL BL (MISCELLANEOUS) ×3 IMPLANT
COVER MAYO STAND STRL (DRAPES) ×6 IMPLANT
COVER SURGICAL LIGHT HANDLE (MISCELLANEOUS) ×3 IMPLANT
COVER WAND RF STERILE (DRAPES) ×3 IMPLANT
DIFFUSER DRILL AIR PNEUMATIC (MISCELLANEOUS) ×3 IMPLANT
DRAIN CHANNEL 15F RND FF W/TCR (WOUND CARE) ×2 IMPLANT
DRAPE C-ARM 42X72 X-RAY (DRAPES) ×3 IMPLANT
DRAPE C-ARMOR (DRAPES) ×2 IMPLANT
DRAPE HALF SHEET 40X57 (DRAPES) ×2 IMPLANT
DRAPE POUCH INSTRU U-SHP 10X18 (DRAPES) ×3 IMPLANT
DRAPE SURG 17X23 STRL (DRAPES) ×12 IMPLANT
DURAPREP 26ML APPLICATOR (WOUND CARE) ×3 IMPLANT
ELECT BLADE 4.0 EZ CLEAN MEGAD (MISCELLANEOUS) ×3
ELECT CAUTERY BLADE 6.4 (BLADE) ×3 IMPLANT
ELECT REM PT RETURN 9FT ADLT (ELECTROSURGICAL) ×3
ELECTRODE BLDE 4.0 EZ CLN MEGD (MISCELLANEOUS) ×1 IMPLANT
ELECTRODE REM PT RTRN 9FT ADLT (ELECTROSURGICAL) ×1 IMPLANT
EVACUATOR SILICONE 100CC (DRAIN) ×2 IMPLANT
FEE INTRAOP MONITOR IMPULS NCS (MISCELLANEOUS) IMPLANT
FILTER STRAW FLUID ASPIR (MISCELLANEOUS) ×3 IMPLANT
GAUZE 4X4 16PLY RFD (DISPOSABLE) ×3 IMPLANT
GAUZE SPONGE 4X4 12PLY STRL (GAUZE/BANDAGES/DRESSINGS) ×3 IMPLANT
GLOVE BIO SURGEON STRL SZ7 (GLOVE) ×7 IMPLANT
GLOVE BIO SURGEON STRL SZ8 (GLOVE) ×5 IMPLANT
GLOVE BIOGEL PI IND STRL 7.0 (GLOVE) ×1 IMPLANT
GLOVE BIOGEL PI IND STRL 8 (GLOVE) ×1 IMPLANT
GLOVE BIOGEL PI INDICATOR 7.0 (GLOVE) ×4
GLOVE BIOGEL PI INDICATOR 8 (GLOVE) ×2
GOWN STRL REUS W/ TWL LRG LVL3 (GOWN DISPOSABLE) ×2 IMPLANT
GOWN STRL REUS W/ TWL XL LVL3 (GOWN DISPOSABLE) ×1 IMPLANT
GOWN STRL REUS W/TWL LRG LVL3 (GOWN DISPOSABLE) ×4
GOWN STRL REUS W/TWL XL LVL3 (GOWN DISPOSABLE) ×2
GRAFT BNE MATRIX VG FRMBL L 10 (Bone Implant) IMPLANT
INTRAOP MONITOR FEE IMPULS NCS (MISCELLANEOUS) ×1
INTRAOP MONITOR FEE IMPULSE (MISCELLANEOUS) ×2
IV CATH 14GX2 1/4 (CATHETERS) ×3 IMPLANT
KIT BASIN OR (CUSTOM PROCEDURE TRAY) ×3 IMPLANT
KIT POSITION SURG JACKSON T1 (MISCELLANEOUS) ×3 IMPLANT
KIT TURNOVER KIT B (KITS) ×3 IMPLANT
MARKER SKIN DUAL TIP RULER LAB (MISCELLANEOUS) ×6 IMPLANT
NDL 18GX1X1/2 (RX/OR ONLY) (NEEDLE) ×1 IMPLANT
NDL HYPO 25GX1X1/2 BEV (NEEDLE) ×1 IMPLANT
NDL SPNL 18GX3.5 QUINCKE PK (NEEDLE) ×2 IMPLANT
NEEDLE 18GX1X1/2 (RX/OR ONLY) (NEEDLE) ×3 IMPLANT
NEEDLE 22X1 1/2 (OR ONLY) (NEEDLE) ×6 IMPLANT
NEEDLE HYPO 25GX1X1/2 BEV (NEEDLE) ×3 IMPLANT
NEEDLE SPNL 18GX3.5 QUINCKE PK (NEEDLE) ×6 IMPLANT
NS IRRIG 1000ML POUR BTL (IV SOLUTION) ×7 IMPLANT
OIL CARTRIDGE MAESTRO DRILL (MISCELLANEOUS) ×3
PACK LAMINECTOMY ORTHO (CUSTOM PROCEDURE TRAY) ×3 IMPLANT
PACK UNIVERSAL I (CUSTOM PROCEDURE TRAY) ×3 IMPLANT
PAD ARMBOARD 7.5X6 YLW CONV (MISCELLANEOUS) ×4 IMPLANT
PATTIES SURGICAL .5 X.5 (GAUZE/BANDAGES/DRESSINGS) ×2 IMPLANT
PATTIES SURGICAL .5 X1 (DISPOSABLE) ×3 IMPLANT
PATTIES SURGICAL .5X1.5 (GAUZE/BANDAGES/DRESSINGS) ×3 IMPLANT
PROBE PEDCLE PROBE MAGSTM DISP (MISCELLANEOUS) ×2 IMPLANT
ROD EXPEDIUM PER BENT 65MM (Rod) ×2 IMPLANT
ROD EXPEDIUM PRE BENT 55MM (Rod) ×2 IMPLANT
SCREW SET SINGLE INNER (Screw) ×12 IMPLANT
SCREW VIPER 7MMX30MM CORTICAL (Screw) ×4 IMPLANT
SCREW VIPER CORT FIX 6.00X30 (Screw) ×4 IMPLANT
SCREW VIPER CORT FIX 6X35 (Screw) ×4 IMPLANT
SPONGE INTESTINAL PEANUT (DISPOSABLE) ×3 IMPLANT
SPONGE SURGIFOAM ABS GEL 100 (HEMOSTASIS) ×3 IMPLANT
STRIP CLOSURE SKIN 1/2X4 (GAUZE/BANDAGES/DRESSINGS) ×4 IMPLANT
SURGIFLO W/THROMBIN 8M KIT (HEMOSTASIS) ×2 IMPLANT
SUT ETHILON 3 0 PS 1 (SUTURE) ×2 IMPLANT
SUT MNCRL AB 4-0 PS2 18 (SUTURE) ×3 IMPLANT
SUT VIC AB 0 CT1 18XCR BRD 8 (SUTURE) ×1 IMPLANT
SUT VIC AB 0 CT1 8-18 (SUTURE) ×2
SUT VIC AB 1 CT1 18XCR BRD 8 (SUTURE) ×1 IMPLANT
SUT VIC AB 1 CT1 8-18 (SUTURE) ×2
SUT VIC AB 2-0 CT2 18 VCP726D (SUTURE) ×5 IMPLANT
SYR 10ML LL (SYRINGE) ×2 IMPLANT
SYR 20ML LL LF (SYRINGE) ×6 IMPLANT
SYR BULB IRRIGATION 50ML (SYRINGE) ×3 IMPLANT
SYR CONTROL 10ML LL (SYRINGE) ×6 IMPLANT
SYR TB 1ML LUER SLIP (SYRINGE) ×3 IMPLANT
TAPE CLOTH SURG 6X10 WHT LF (GAUZE/BANDAGES/DRESSINGS) ×2 IMPLANT
TRAY FOLEY MTR SLVR 16FR STAT (SET/KITS/TRAYS/PACK) ×3 IMPLANT
WATER STERILE IRR 1000ML POUR (IV SOLUTION) ×3 IMPLANT
YANKAUER SUCT BULB TIP NO VENT (SUCTIONS) ×3 IMPLANT

## 2019-02-25 NOTE — Anesthesia Preprocedure Evaluation (Signed)

## 2019-02-25 NOTE — H&P (Signed)
PREOPERATIVE H&P  Chief Complaint: Left leg pain and weakness  HPI: Melissa Walsh is a 53 y.o. female who presents with ongoing pain in the left leg as well as progressive left foot drop  MRI reveals instability and stenosis at L4/5 and L5/S1  Patient has failed multiple forms of conservative care and continues to have pain (see office notes for additional details regarding the patient's full course of treatment)  Past Medical History:  Diagnosis Date  . Anxiety   . Depression   . Thyroid disease    Past Surgical History:  Procedure Laterality Date  . PLACEMENT OF BREAST IMPLANTS     Social History   Socioeconomic History  . Marital status: Married    Spouse name: Not on file  . Number of children: Not on file  . Years of education: Not on file  . Highest education level: Not on file  Occupational History  . Not on file  Social Needs  . Financial resource strain: Not on file  . Food insecurity    Worry: Not on file    Inability: Not on file  . Transportation needs    Medical: Not on file    Non-medical: Not on file  Tobacco Use  . Smoking status: Never Smoker  . Smokeless tobacco: Never Used  Substance and Sexual Activity  . Alcohol use: Not Currently  . Drug use: No  . Sexual activity: Not on file  Lifestyle  . Physical activity    Days per week: Not on file    Minutes per session: Not on file  . Stress: Not on file  Relationships  . Social Musicianconnections    Talks on phone: Not on file    Gets together: Not on file    Attends religious service: Not on file    Active member of club or organization: Not on file    Attends meetings of clubs or organizations: Not on file    Relationship status: Not on file  Other Topics Concern  . Not on file  Social History Narrative  . Not on file   Family History  Problem Relation Age of Onset  . Alzheimer's disease Mother   . Healthy Father    No Known Allergies Prior to Admission medications   Medication  Sig Start Date End Date Taking? Authorizing Provider  escitalopram (LEXAPRO) 20 MG tablet Take 20 mg by mouth daily.   Yes [provider]  thyroid (ARMOUR THYROID) 120 MG tablet Take 120 mg by mouth daily.    Yes [provider]  zolpidem (AMBIEN) 10 MG tablet Take 10 mg by mouth at bedtime as needed for sleep.   Yes [provider]  benzonatate (TESSALON) 200 MG capsule Take one cap TID PRN cough Patient not taking: Reported on 02/18/2019 01/24/17   Lutricia Feiloemer, William P, PA-C  chlorpheniramine-HYDROcodone [facility](TUSSIONEX PENNKINETIC ER) 10-8 MG/5ML SUER Take 5 mLs by mouth 2 (two) times daily. Patient not taking: Reported on 02/18/2019 01/24/17   Lutricia Feiloemer, William P, PA-C  HYDROcodone-homatropine [facility](HYCODAN) 5-1.5 MG/5ML syrup Take 5 mLs by mouth every 6 (six) hours as needed for cough. Patient not taking: Reported on 02/18/2019 08/29/18   Elson AreasSofia, Leslie K, PA-C  predniSONE (DELTASONE) 20 MG tablet Take 2 tablets (40 mg) daily by mouth Patient not taking: Reported on 02/18/2019 01/24/17   Lutricia Feiloemer, William P, PA-C     All other systems have been reviewed and were otherwise negative with the exception of those mentioned in  the HPI and as above.  Physical Exam: There were no vitals filed for this visit.  There is no height or weight on file to calculate BMI.  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

## 2019-02-25 NOTE — Transfer of Care (Signed)
Immediate Anesthesia Transfer of Care Note  Patient: Melissa Walsh  Procedure(s) Performed: LEFT-SIDED LUMBAR 4-5, LUMBAR 5- SACRUM 1  TRANSFORAMINAL LUMBAR INTERBODY FUSION WITH INSTRUMENTATION AND ALLOGRAFT (Left )  Patient Location: PACU  Anesthesia Type:General  Level of Consciousness: awake, alert  and oriented  Airway & Oxygen Therapy: Patient Spontanous Breathing and Patient connected to nasal cannula oxygen  Post-op Assessment: Report given to RN and Post -op Vital signs reviewed and stable  Post vital signs: Reviewed and stable  Last Vitals:  Vitals Value Taken Time  BP 121/77 02/25/19 1709  Temp 36.6 C 02/25/19 1705  Pulse 109 02/25/19 1723  Resp 16 02/25/19 1723  SpO2 98 % 02/25/19 1723  Vitals shown include unvalidated device data.  Last Pain:  Vitals:   02/25/19 1710  TempSrc:   PainSc: Asleep         Complications: No apparent anesthesia complications

## 2019-02-25 NOTE — Op Note (Signed)
PATIENT NAME: Melissa Walsh   MEDICAL RECORD NO.:   [id number]    PHYSICIAN:  Estill BambergMark Debbi Strandberg, MD      DATE OF BIRTH: [birth year]  ASSISTANT: Jason CoopKAYLA MCKENZIE, PA-C   DATE OF PROCEDURE: 02/25/2019                                                            OPERATIVE REPORT     PREOPERATIVE DIAGNOSES: 1. Severe left-sided lumbar radiculopathy, resulting in left leg pain, numbness, and weakness, including foot drop 2. L4/5, L5/S1 spinal stenosis. 3. L4/5, L5/S1 spondylolisthesis 4. Large left L5/S1 facet cyst, severely compressing the exiting left L5 nerve 5. Severe degenerative disc disease, L4/5, L5/S1   POSTOPERATIVE DIAGNOSES: 1. Severe left-sided lumbar radiculopathy, resulting in left leg pain, numbness, and weakness, including foot drop 2. L4/5, L5/S1 spinal stenosis. 3. L4/5, L5-S1 spondylolisthesis 4. Large left L5/S1 facet cyst, severely compressing the exiting left L5 nerve 5. Severe degenerative disc disease, L4/5, L5/S1   PROCEDURES: 1. Decompression L4-5, L5-S1, including removal of left-sided L5-S1 facet cyst 2. Left-sided L4/5, L5/S1 transforaminal lumbar interbody fusion. 3. Right-sided L4-5, L5/S1 posterolateral fusion. 4. Insertion of interbody device x2 (Concorde intervertebral spacers). 5. Placement of posterior instrumentation L4, L5, S1 bilaterally. 6. Use of local autograft. 7. Use of morselized allograft - Vivigen 8. Intraoperative use of fluoroscopy.   SURGEON:  Estill BambergMark Jonelle Bann, MD.   ASSISTANJason Coop:  Kayla McKenzie, PA-C.   ANESTHESIA:  General endotracheal anesthesia.   COMPLICATIONS:  None.   DISPOSITION:  Stable.   ESTIMATED BLOOD LOSS:  400cc (200 cc retransfused with Cell Saver)   INDICATIONS FOR SURGERY:  Briefly,  Bevie a pleasant 53 year old female who did present to me with severe and ongoing pain, numbness, and weakness involving the left leg and foot.  She gained no improvement with appropriate conservative treatment, and I did feel that her  symptoms were secondary to the findings noted above. She did wish to proceed with the procedure noted above.   OPERATIVE DETAILS:  On  02/25/2019, the patient was brought to surgery and general endotracheal anesthesia was administered.  The patient was placed prone on a well-padded flat Jackson bed with a spinal frame.  Antibiotics were given and a time-out procedure was performed. The back was prepped and draped in the usual fashion.  A midline incision was made overlying the L4-5 and L5-S1 intervertebral spaces.  The fascia was incised at the midline.  The paraspinal musculature was bluntly swept laterally.  Anatomic landmarks for the pedicles were exposed. Using fluoroscopy, I did cannulate the L4, L5, and S1 pedicles bilaterally, using a medial to lateral cortical trajectory technique at L4 and L5, and using a straight/medial trajectory technique at S1 bilaterally.  On the right side, the posterolateral gutter and right facet joint at L4-5 and L5-S1 was decorticated and 6 mm screws of the appropriate length were placed at L4 and L5, and a 7 mm screw was placed at S1 and a 65-mm rod was placed and distraction was applied across the rod at each intervertebral level.  On the Left side, the cannulated pedicle holes were filled with bone wax.  I then proceeded with the decompressive aspect of the procedure.     Starting at L5-S1, I did perform a laminotomy and a full facetectomy on  the left.  In the region of the left lateral recess and right foramen, there was noted to be a complex left L5-S1 facet cyst.  This was immediately dorsal to the exiting left L5 nerve.  A plane was developed the exiting left L5 nerve and the facet cyst.  The facet cyst was removed in its entirety, which did allow complete decompression of the nerve and lateral recess. Once this was done, with an assistant holding medial retraction of the traversing left S1 nerve, I did perform a thorough and complete L5-S1 intervertebral  discectomy.  The intervertebral space was then liberally packed with autograft from the decompression, as well as allograft in the form of Vivigen, as was the appropriately sized intervertebral spacer (8 mm).  Distraction was then released on the contralateral right side.  I then turned my attention to the L4-5 level.  There was bilateral ligamentum flavum hypertrophy noted, which was removed using Kerrison punches.  With an assistant holding medial retraction of the traversing left L5 nerve, I did perform an annulotomy at the posterolateral aspect of the L4-5 intervertebral space.  I then used a series of curettes and pituitary rongeurs to perform a thorough and complete intervertebral diskectomy.  The intervertebral space was then liberally packed with autograft as well as allograft in the form of Vivigen, as was the appropriate-sized intervertebral spacer (9 mm).  The spacer was then tamped into position in the usual fashion.  I was very pleased with the press-fit of the spacer.  I then placed 6 mm screws on the left at L4 and L5, and a 7 mm screw at S1 on the right side.  A 55-mm rod was then placed and caps were placed. The distraction was then released on the contralateral side.  All 6 caps were then locked.  The wound was copiously irrigated with a total of approximately 3 L prior to placing the bone graft.  Additional autograft and allograft was then packed into the posterolateral gutter on the left side to help aid in the success of the fusion. The wound was explored for any undue bleeding and there was no substantial bleeding encountered.  Gel-Foam was placed over the laminectomy site.  The wound was then closed in layers using #1 Vicryl followed by 2-0 Vicryl, followed by 4-0 Monocryl.  Benzoin and Steri-Strips were applied followed by sterile dressing.  Of note, a deep #15 Blake drain was placed prior to closure.   Of note, did use triggered EMG to test the screws on the left, and there is no  screw the tested below 20 mA.  There is no sustained abnormal EMG activity noted throughout the surgery.   Of note, Pricilla Holm was my assistant throughout surgery, and did aid in retraction, suctioning, and closure.    Phylliss Bob, MD

## 2019-02-25 NOTE — Anesthesia Procedure Notes (Signed)
Procedure Name: Intubation Date/Time: 02/25/2019 10:49 AM Performed by: Colin Benton, CRNA Pre-anesthesia Checklist: Patient identified, Emergency Drugs available, Suction available and Patient being monitored Patient Re-evaluated:Patient Re-evaluated prior to induction Oxygen Delivery Method: Circle system utilized Preoxygenation: Pre-oxygenation with 100% oxygen Induction Type: IV induction Ventilation: Mask ventilation without difficulty Laryngoscope Size: Miller and 2 Grade View: Grade I Tube type: Oral Tube size: 7.0 mm Number of attempts: 1 Airway Equipment and Method: Stylet Placement Confirmation: ETT inserted through vocal cords under direct vision,  positive ETCO2 and breath sounds checked- equal and bilateral Secured at: 21 cm Tube secured with: Tape Dental Injury: Teeth and Oropharynx as per pre-operative assessment

## 2019-02-25 NOTE — Anesthesia Postprocedure Evaluation (Signed)
Anesthesia Post Note  Patient: Melissa Walsh  Procedure(s) Performed: LEFT-SIDED LUMBAR 4-5, LUMBAR 5- SACRUM 1  TRANSFORAMINAL LUMBAR INTERBODY FUSION WITH INSTRUMENTATION AND ALLOGRAFT (Left )     Patient location during evaluation: PACU Anesthesia Type: General Level of consciousness: awake and alert Pain management: pain level controlled Vital Signs Assessment: post-procedure vital signs reviewed and stable Respiratory status: spontaneous breathing, nonlabored ventilation, respiratory function stable and patient connected to nasal cannula oxygen Cardiovascular status: blood pressure returned to baseline and stable Postop Assessment: no apparent nausea or vomiting Anesthetic complications: no    Last Vitals:  Vitals:   02/25/19 0802 02/25/19 1705  BP: 129/78 121/77  Pulse: 80   Resp: 18 (!) 26  Temp: 36.8 C 36.6 C  SpO2: 100%     Last Pain:  Vitals:   02/25/19 1710  TempSrc:   PainSc: Asleep                 Elazar Argabright COKER

## 2019-02-26 ENCOUNTER — Encounter (HOSPITAL_COMMUNITY): Payer: Self-pay | Admitting: Orthopedic Surgery

## 2019-02-26 MED ORDER — HYDROMORPHONE HCL 2 MG PO TABS: 1.0000 mg | ORAL_TABLET | ORAL | 0 refills | Status: DC | PRN

## 2019-02-26 MED ORDER — DIPHENHYDRAMINE HCL 25 MG PO CAPS
ORAL_CAPSULE | ORAL | Status: AC
  Filled 2019-02-26: qty 1

## 2019-02-26 MED ORDER — HYDROMORPHONE HCL 2 MG PO TABS
1.0000 mg | ORAL_TABLET | ORAL | Status: DC | PRN
  Administered 2019-02-26: 1 mg via ORAL
  Filled 2019-02-26: qty 1

## 2019-02-26 MED ORDER — HYDROXYZINE HCL 50 MG/ML IM SOLN
50.0000 mg | Freq: Four times a day (QID) | INTRAMUSCULAR | Status: DC | PRN
  Administered 2019-02-26: 50 mg via INTRAMUSCULAR
  Filled 2019-02-26: qty 1

## 2019-02-26 MED ORDER — HYDROXYZINE HCL 50 MG/ML IM SOLN: 50.0000 mg | Freq: Four times a day (QID) | INTRAMUSCULAR | Status: DC | PRN

## 2019-02-26 MED ORDER — DIAZEPAM 5 MG PO TABS: 5.0000 mg | ORAL_TABLET | Freq: Four times a day (QID) | ORAL | 0 refills | Status: DC | PRN

## 2019-02-26 MED ORDER — DIPHENHYDRAMINE HCL 25 MG PO CAPS
50.0000 mg | ORAL_CAPSULE | Freq: Four times a day (QID) | ORAL | Status: DC | PRN
  Administered 2019-02-26 (×2): 50 mg via ORAL
  Filled 2019-02-26: qty 2

## 2019-02-26 MED FILL — Thrombin (Recombinant) For Soln 20000 Unit: CUTANEOUS | Qty: 1 | Status: AC

## 2019-02-26 NOTE — Progress Notes (Signed)
Patient is discharged from room 3C03 at this time. Alert and in stable condition. IV site d/c'd and instructions read to patient with understanding verbalized. Left unit via wheelchair with all belongings at side. 

## 2019-02-26 NOTE — Progress Notes (Signed)
Occupational Therapy Evaluation Patient Details Name: Melissa Walsh MRN: [id number] DOB: [birth year] Today's Date: 02/26/2019    History of Present Illness Patient s/p LEFT-SIDED LUMBAR 4-5, LUMBAR 5- SACRUM 1  TRANSFORAMINAL LUMBAR INTERBODY FUSION on 02/25/2019.   Clinical Impression   Pt s/p above procedure. PTA, pt is independent with ADLs and functional mobility. Today, pt is able to independently verbalize her back precautions. She completed UB and LB dressing tasks at mod I level while independently maintain back precautions. Min verbal cues for bed mobility (log roll technique).  Therapist completed education on strategies/techniques to assist with adhering to back precautions during ADLs at home. No further acute OT needs at this time. Will sign off.     Follow Up Recommendations  No OT follow up;Supervision - Intermittent    Equipment Recommendations  None recommended by OT    Recommendations for Other Services       Precautions / Restrictions Precautions Precautions: Back;Fall Precaution Booklet Issued: (handout issued in previous PT session) Precaution Comments: Pt able to independently recall 3/3 back precautions at start of evaluation. Required Braces or Orthoses: Spinal Brace Spinal Brace: Thoracolumbosacral orthotic;Applied in standing position Restrictions Weight Bearing Restrictions: No      Mobility Bed Mobility Overal bed mobility: Needs Assistance Bed Mobility: Rolling;Sit to Sidelying;Sidelying to Sit Rolling: Supervision Sidelying to sit: Supervision     Sit to sidelying: Supervision General bed mobility comments: Min verbal cues for log roll technique.  Transfers Overall transfer level: Needs assistance Equipment used: None Transfers: Sit to/from Stand Sit to Stand: Modified independent (Device/Increase time)            Balance Overall balance assessment: Modified Independent                                         ADL  either performed or assessed with clinical judgement   ADL Overall ADL's : Modified independent                                       General ADL Comments: Pt donned UB/LB clothing while independently maintaining back precautions.  Able to use figure four position to don LB clothing.  Therapist educated pt on use of 2 cups for teethbrushing at sink to avoid bending. Also recommended long handled sponge use in shower to avoid bending.      Vision         Perception     Praxis      Pertinent Vitals/Pain Pain Assessment: 0-10 Pain Score: 3  Pain Location: back Pain Descriptors / Indicators: Discomfort Pain Intervention(s): Monitored during session;Limited activity within patient's tolerance;Repositioned     Hand Dominance     Extremity/Trunk Assessment Upper Extremity Assessment Upper Extremity Assessment: Overall WFL for tasks assessed   Lower Extremity Assessment Lower Extremity Assessment: Defer to PT evaluation   Cervical / Trunk Assessment Cervical / Trunk Assessment: Normal   Communication Communication Communication: No difficulties   Cognition Arousal/Alertness: Awake/alert Behavior During Therapy: WFL for tasks assessed/performed Overall Cognitive Status: Within Functional Limits for tasks assessed                                     General Comments  very pleasant  Exercises     Shoulder Instructions      Home Living Family/patient expects to be discharged to:: Private residence Living Arrangements: Spouse/significant other Available Help at Discharge: Family;Available 24 hours/day Type of Home: (camper) Home Access: Stairs to enter Entrance Stairs-Number of Steps: 5   Home Layout: One level     Bathroom Shower/Tub: Occupational psychologist: Standard     Home Equipment: None          Prior Functioning/Environment Level of Independence: Independent        Comments: work as a Therapist, sports in [place]         OT Problem List: Pain      OT Treatment/Interventions:      OT Goals(Current goals can be found in the care plan section) Acute Rehab OT Goals Patient Stated Goal: go home today  OT Frequency:     Barriers to D/C:            Co-evaluation              AM-PAC OT "6 Clicks" Daily Activity     Outcome Measure Help from another person eating meals?: None Help from another person taking care of personal grooming?: None Help from another person toileting, which includes using toliet, bedpan, or urinal?: None Help from another person bathing (including washing, rinsing, drying)?: None Help from another person to put on and taking off regular upper body clothing?: None Help from another person to put on and taking off regular lower body clothing?: None 6 Click Score: 24   End of Session Equipment Utilized During Treatment: Back brace Nurse Communication: Other (comment)(pt finished with therapy)  Activity Tolerance: Patient tolerated treatment well Patient left: in bed;with call bell/phone within reach  OT Visit Diagnosis: Pain Pain - part of body: (back)                Time: 0175-1025 OT Time Calculation (min): 20 min Charges:  OT General Charges $OT Visit: 1 Visit OT Evaluation $OT Eval Low Complexity: 1 Low   Darrol Jump OTR/L [place] [phone] 02/26/2019, 9:40 AM

## 2019-02-26 NOTE — Progress Notes (Signed)
Orthopedic Tech Progress Note Patient Details:  Melissa Walsh 11-22-1965 [id number] Nurse stated pt had tlso brace. Patient ID: [name], female   DOB: Mar 27, 1966, 53 y.o.   MRN: [id number]   [name] 02/26/2019, 6:18 AM

## 2019-02-26 NOTE — Evaluation (Signed)
Physical Therapy Evaluation Patient Details Name: Melissa Walsh MRN: [id number] DOB: 26-Jun-1966 Today's Date: 02/26/2019   History of Present Illness  Patient s/p LEFT-SIDED LUMBAR 4-5, LUMBAR 5- SACRUM 1  TRANSFORAMINAL LUMBAR INTERBODY FUSION on 02/25/2019.    Clinical Impression  Patient admitted s/p above procedure. Patient independent at baseline for all mobility and ADLs. Patient today educated on back precautions, log rolling and brace application and wear schedule with good understanding. General supervision in session for safety. Ambulating in hallway and up/down 10 steps without issue - no LOB or need for assist from PT. No further acute PT needs identified. PT to sign off.      Follow Up Recommendations No PT follow up    Equipment Recommendations  None recommended by PT    Recommendations for Other Services       Precautions / Restrictions Precautions Precautions: Back;Fall Precaution Booklet Issued: Yes (comment) Precaution Comments: reviewed back precautions, log rolling - handout given Required Braces or Orthoses: Spinal Brace Spinal Brace: Thoracolumbosacral orthotic;Applied in standing position Restrictions Weight Bearing Restrictions: No      Mobility  Bed Mobility Overal bed mobility: Needs Assistance Bed Mobility: Rolling;Sidelying to Sit Rolling: Supervision Sidelying to sit: Supervision       General bed mobility comments: cueing for log rolling  Transfers Overall transfer level: Needs assistance Equipment used: None Transfers: Sit to/from Stand Sit to Stand: Supervision         General transfer comment: for safety and immediate standing balance  Ambulation/Gait Ambulation/Gait assistance: Supervision Gait Distance (Feet): (>300) Assistive device: None Gait Pattern/deviations: Step-through pattern Gait velocity: WNL   General Gait Details: no noted episodes of foot drop; no LOB or overt instability  Stairs Stairs: Yes Stairs  assistance: Supervision Stair Management: One rail Right;Step to pattern;Forwards Number of Stairs: 10    Wheelchair Mobility    Modified Rankin (Stroke Patients Only)       Balance Overall balance assessment: Modified Independent                                           Pertinent Vitals/Pain Pain Assessment: 0-10 Pain Score: 3  Pain Location: back Pain Descriptors / Indicators: Discomfort Pain Intervention(s): Limited activity within patient's tolerance;Monitored during session;Repositioned    Home Living Family/patient expects to be discharged to:: Private residence Living Arrangements: Spouse/significant other Available Help at Discharge: Family;Available 24 hours/day Type of Home: (camper) Home Access: Stairs to enter   Entrance Stairs-Number of Steps: 5 Home Layout: One level Home Equipment: None      Prior Function Level of Independence: Independent         Comments: work as a Therapist, sports in [employer]        Extremity/Trunk Assessment   Upper Extremity Assessment Upper Extremity Assessment: Overall WFL for tasks assessed    Lower Extremity Assessment Lower Extremity Assessment: Overall WFL for tasks assessed    Cervical / Trunk Assessment Cervical / Trunk Assessment: Normal  Communication   Communication: No difficulties  Cognition Arousal/Alertness: Awake/alert Behavior During Therapy: WFL for tasks assessed/performed Overall Cognitive Status: Within Functional Limits for tasks assessed                                        General Comments General  comments (skin integrity, edema, etc.): very pleasant    Exercises     Assessment/Plan    PT Assessment Patent does not need any further PT services  PT Problem List         PT Treatment Interventions      PT Goals (Current goals can be found in the Care Plan section)  Acute Rehab PT Goals Patient Stated Goal: go home today PT Goal  Formulation: With patient Time For Goal Achievement: 03/12/19 Potential to Achieve Goals: Good    Frequency     Barriers to discharge        Co-evaluation               AM-PAC PT "6 Clicks" Mobility  Outcome Measure Help needed turning from your back to your side while in a flat bed without using bedrails?: A Little Help needed moving from lying on your back to sitting on the side of a flat bed without using bedrails?: A Little Help needed moving to and from a bed to a chair (including a wheelchair)?: A Little Help needed standing up from a chair using your arms (e.g., wheelchair or bedside chair)?: A Little Help needed to walk in hospital room?: A Little Help needed climbing 3-5 steps with a railing? : A Little 6 Click Score: 18    End of Session Equipment Utilized During Treatment: Back brace Activity Tolerance: Patient tolerated treatment well Patient left: in bed;with call bell/phone within reach(sitting EOB eating breakfast) Nurse Communication: Mobility status PT Visit Diagnosis: Unsteadiness on feet (R26.81);Pain Pain - part of body: (back)    Time: [id number] PT Time Calculation (min) (ACUTE ONLY): 16 min   Charges:   PT Evaluation $PT Eval Low Complexity: 1 Low          Kipp LaurenceStephanie R Cam Harnden, PT, DPT Supplemental Physical Therapist 02/26/19 8:55 AM Pager: [phone] Office: [phone]

## 2019-02-26 NOTE — Progress Notes (Signed)
    Patient doing well  Denies leg pain Minimal back discomfort Percocet is causing itching   Physical Exam: Vitals:   02/25/19 2303 02/26/19 0433  BP: (!) 126/91 115/71  Pulse: 99 93  Resp: 18 18  Temp: 98.1 F (36.7 C) 98.1 F (36.7 C)  SpO2: 99% 100%    Dressing in place + DF and EHL weakness on the left, however improved vs baseline  Drain output: 110 since surgery  POD #1 s/p L4-S1 decompression and fusion, doing well  - up with PT/OT, encourage ambulation - Will d/c Percocet and start dilaudid, Valium for muscle spasms - d/c home today vs tomorrow, depending on PT progress, with f/u in 2 weeks - d/c drain prior to d/c

## 2019-02-27 ENCOUNTER — Inpatient Hospital Stay (HOSPITAL_COMMUNITY)
Admission: EM | Admit: 2019-02-27 | Discharge: 2019-02-28 | Disposition: A | Discharge status: Home / Self Care | Payer: BC Managed Care – PPO | Source: Home / Self Care | Attending: Orthopedic Surgery | Admitting: Orthopedic Surgery

## 2019-02-27 ENCOUNTER — Encounter (HOSPITAL_COMMUNITY): Payer: Self-pay | Admitting: Emergency Medicine

## 2019-02-27 ENCOUNTER — Other Ambulatory Visit: Payer: Self-pay

## 2019-02-27 DIAGNOSIS — M5410 Radiculopathy, site unspecified: Secondary | ICD-10-CM | POA: Diagnosis present

## 2019-02-27 DIAGNOSIS — G8918 Other acute postprocedural pain: Secondary | ICD-10-CM

## 2019-02-27 MED ORDER — ONDANSETRON HCL 4 MG PO TABS: 4.0000 mg | ORAL_TABLET | Freq: Four times a day (QID) | ORAL | Status: DC | PRN

## 2019-02-27 MED ORDER — SODIUM CHLORIDE 0.9 % IV SOLN
250.0000 mL | INTRAVENOUS | Status: DC
  Administered 2019-02-27: 250 mL via INTRAVENOUS

## 2019-02-27 MED ORDER — SODIUM CHLORIDE 0.9% FLUSH
3.0000 mL | Freq: Two times a day (BID) | INTRAVENOUS | Status: DC
  Administered 2019-02-27 (×2): 3 mL via INTRAVENOUS

## 2019-02-27 MED ORDER — KETOROLAC TROMETHAMINE 30 MG/ML IJ SOLN
30.0000 mg | Freq: Once | INTRAMUSCULAR | Status: AC
  Administered 2019-02-27: 30 mg via INTRAVENOUS
  Filled 2019-02-27: qty 1

## 2019-02-27 MED ORDER — ZOLPIDEM TARTRATE 5 MG PO TABS: 5.0000 mg | ORAL_TABLET | Freq: Every evening | ORAL | Status: DC | PRN

## 2019-02-27 MED ORDER — HYDROMORPHONE HCL 2 MG PO TABS
1.0000 mg | ORAL_TABLET | Freq: Four times a day (QID) | ORAL | Status: DC | PRN
  Administered 2019-02-27 – 2019-02-28 (×4): 2 mg via ORAL
  Filled 2019-02-27 (×4): qty 1

## 2019-02-27 MED ORDER — POTASSIUM CHLORIDE IN NACL 20-0.9 MEQ/L-% IV SOLN: INTRAVENOUS | Status: DC

## 2019-02-27 MED ORDER — SENNOSIDES-DOCUSATE SODIUM 8.6-50 MG PO TABS: 1.0000 | ORAL_TABLET | Freq: Every evening | ORAL | Status: DC | PRN

## 2019-02-27 MED ORDER — MENTHOL 3 MG MT LOZG: 1.0000 | LOZENGE | OROMUCOSAL | Status: DC | PRN

## 2019-02-27 MED ORDER — SODIUM CHLORIDE 0.9% FLUSH: 3.0000 mL | INTRAVENOUS | Status: DC | PRN

## 2019-02-27 MED ORDER — ALUM & MAG HYDROXIDE-SIMETH 200-200-20 MG/5ML PO SUSP: 30.0000 mL | Freq: Four times a day (QID) | ORAL | Status: DC | PRN

## 2019-02-27 MED ORDER — BISACODYL 5 MG PO TBEC: 5.0000 mg | DELAYED_RELEASE_TABLET | Freq: Every day | ORAL | Status: DC | PRN

## 2019-02-27 MED ORDER — ONDANSETRON HCL 4 MG/2ML IJ SOLN: 4.0000 mg | Freq: Four times a day (QID) | INTRAMUSCULAR | Status: DC | PRN

## 2019-02-27 MED ORDER — FLEET ENEMA 7-19 GM/118ML RE ENEM: 1.0000 | ENEMA | Freq: Once | RECTAL | Status: DC | PRN

## 2019-02-27 MED ORDER — DIAZEPAM 5 MG PO TABS
5.0000 mg | ORAL_TABLET | Freq: Four times a day (QID) | ORAL | Status: DC | PRN
  Administered 2019-02-27 – 2019-02-28 (×4): 5 mg via ORAL
  Filled 2019-02-27 (×4): qty 1

## 2019-02-27 MED ORDER — CEFAZOLIN SODIUM-DEXTROSE 2-4 GM/100ML-% IV SOLN: 2.0000 g | Freq: Three times a day (TID) | INTRAVENOUS | Status: DC

## 2019-02-27 MED ORDER — HYDROMORPHONE HCL 1 MG/ML IJ SOLN
1.0000 mg | Freq: Once | INTRAMUSCULAR | Status: AC
  Administered 2019-02-27: 1 mg via INTRAVENOUS
  Filled 2019-02-27: qty 1

## 2019-02-27 MED ORDER — DOCUSATE SODIUM 100 MG PO CAPS
100.0000 mg | ORAL_CAPSULE | Freq: Two times a day (BID) | ORAL | Status: DC
  Administered 2019-02-27 – 2019-02-28 (×3): 100 mg via ORAL
  Filled 2019-02-27 (×3): qty 1

## 2019-02-27 MED ORDER — MORPHINE SULFATE (PF) 2 MG/ML IV SOLN: 1.0000 mg | INTRAVENOUS | Status: DC | PRN

## 2019-02-27 MED ORDER — PHENOL 1.4 % MT LIQD: 1.0000 | OROMUCOSAL | Status: DC | PRN

## 2019-02-27 MED ORDER — ACETAMINOPHEN 325 MG PO TABS: 650.0000 mg | ORAL_TABLET | ORAL | Status: DC | PRN

## 2019-02-27 MED ORDER — METHOCARBAMOL 1000 MG/10ML IJ SOLN
500.0000 mg | Freq: Four times a day (QID) | INTRAVENOUS | Status: DC
  Administered 2019-02-27 – 2019-02-28 (×4): 500 mg via INTRAVENOUS
  Filled 2019-02-27 (×7): qty 5

## 2019-02-27 MED ORDER — ACETAMINOPHEN 650 MG RE SUPP: 650.0000 mg | RECTAL | Status: DC | PRN

## 2019-02-27 NOTE — ED Triage Notes (Signed)
Pt BIB [facility] c/o worsening spasms. Pt reports spinal fusion of L4 L5 and S1 on Wednesday. Pt. Taking dilaudid and Valium at home. Last taken 4:45 am with no relief of spasms.   EMS VS BP 126/70 HR 105 98% RA

## 2019-02-27 NOTE — ED Provider Notes (Addendum)
[facility]   CSN: [id number] Arrival date & time: 02/27/19  0636    History   Chief Complaint Post op back pain  HPI Melissa Walsh is a 53 y.o. female.     HPI Pt presents to the ED with back pain after surgery Wednesday and went home yesterday.  She had a L4L5 and L5S1 lumbar fusion by Dr Yevette Edwardsumonski. This morning her pain was severe.  She was having back spasms and could not get out of bed.   The pain is primarily in her back.   It increases with any movement.    Pt has been taking dilaudid and valium. She tried percocet but she started itching.  It helps a little but was not that effective as time went on.   Past Medical History:  Diagnosis Date  . Anxiety   . Depression   . Thyroid disease     Patient Active Problem List   Diagnosis Date Noted  . Radiculopathy 02/25/2019    Past Surgical History:  Procedure Laterality Date  . PLACEMENT OF BREAST IMPLANTS    . TRANSFORAMINAL LUMBAR INTERBODY FUSION (TLIF) WITH PEDICLE SCREW FIXATION 2 LEVEL Left 02/25/2019   Procedure: LEFT-SIDED LUMBAR 4-5, LUMBAR 5- SACRUM 1  TRANSFORAMINAL LUMBAR INTERBODY FUSION WITH INSTRUMENTATION AND ALLOGRAFT;  Surgeon: Estill Bambergumonski, Mark, MD;  Location: [facility] OR;  Service: Orthopedics;  Laterality: Left;     OB History   No obstetric history on file.      Home Medications    Prior to Admission medications   Medication Sig Start Date End Date Taking? Authorizing Provider  benzonatate (TESSALON) 200 MG capsule Take one cap TID PRN cough Patient not taking: Reported on 02/18/2019 01/24/17   Lutricia Feiloemer, William P, PA-C  chlorpheniramine-HYDROcodone [facility](TUSSIONEX PENNKINETIC ER) 10-8 MG/5ML SUER Take 5 mLs by mouth 2 (two) times daily. Patient not taking: Reported on 02/18/2019 01/24/17   Ovid Curdoemer, William P, PA-C  diazepam (VALIUM) 5 MG tablet Take 1 tablet (5 mg total) by mouth every 6 (six) hours as needed for muscle spasms. 02/26/19   Estill Bambergumonski, Mark, MD   escitalopram (LEXAPRO) 20 MG tablet Take 20 mg by mouth daily.    [provider]  HYDROmorphone (DILAUDID) 2 MG tablet Take 0.5 tablets (1 mg total) by mouth every 4 (four) hours as needed for severe pain. 02/26/19   Estill Bambergumonski, Mark, MD  thyroid [facility](ARMOUR THYROID) 120 MG tablet Take 120 mg by mouth daily.     [provider]  zolpidem (AMBIEN) 10 MG tablet Take 10 mg by mouth at bedtime as needed for sleep.    [provider]    Family History Family History  Problem Relation Age of Onset  . Alzheimer's disease Mother   . Healthy Father     Social History Social History   Tobacco Use  . Smoking status: Never Smoker  . Smokeless tobacco: Never Used  Substance Use Topics  . Alcohol use: Not Currently  . Drug use: No     Allergies   Patient has no known allergies.   Review of Systems Review of Systems   Physical Exam Updated Vital Signs BP 124/76   Pulse (!) 109   Temp 98.3 F (36.8 C)   Resp 18   SpO2 94%   Physical Exam Vitals signs and nursing note reviewed.  Constitutional:      General: She is not in acute distress.    Appearance: She is well-developed.  HENT:  Head: Normocephalic and atraumatic.     Right Ear: External ear normal.     Left Ear: External ear normal.  Eyes:     General: No scleral icterus.       Right eye: No discharge.        Left eye: No discharge.     Conjunctiva/sclera: Conjunctivae normal.  Neck:     Musculoskeletal: Neck supple.     Trachea: No tracheal deviation.  Cardiovascular:     Rate and Rhythm: Normal rate and regular rhythm.  Pulmonary:     Effort: Pulmonary effort is normal. No respiratory distress.     Breath sounds: Normal breath sounds. No stridor. No wheezing or rales.  Abdominal:     General: Bowel sounds are normal. There is no distension.     Palpations: Abdomen is soft.     Tenderness: There is no abdominal tenderness. There is no guarding or rebound.  Musculoskeletal:     Lumbar  back: She exhibits decreased range of motion and tenderness.  Skin:    General: Skin is warm and dry.     Findings: No rash.  Neurological:     Mental Status: She is alert.     Cranial Nerves: No cranial nerve deficit (no facial droop, extraocular movements intact, no slurred speech).     Sensory: No sensory deficit.     Motor: No abnormal muscle tone or seizure activity.     Coordination: Coordination normal.     Comments: Normal strength and sensation bilateral lower extremities      ED Treatments / Results  Labs (all labs ordered are listed, but only abnormal results are displayed) Labs Reviewed - No data to display  EKG None  Radiology Dg Lumbar Spine 2-3 Views  Result Date: 02/25/2019 CLINICAL DATA:  Status post surgical posterior fusion of L4-5 and L5-S1. EXAM: LUMBAR SPINE - 2-3 VIEW; DG C-ARM 61-120 MIN FLUOROSCOPY TIME:  1 minutes 22 seconds. COMPARISON:  None. FINDINGS: Two intraoperative fluoroscopic images were obtained of the lower lumbar spine. These demonstrate the patient be status post surgical posterior fusion of L4-5 and L5-S1 with bilateral intrapedicular screw placement and interbody fusion. IMPRESSION: Fluoroscopic guidance provided during surgical posterior fusion of lower lumbar spine. Electronically Signed   By: Lupita RaiderJames  Green Jr M.D.   On: 02/25/2019 16:56   Dg Lumbar Spine 1 View  Result Date: 02/25/2019 CLINICAL DATA:  Lumbar surgery L4-L5 and L5-S1 EXAM: LUMBAR SPINE - 1 VIEW COMPARISON:  MRI lumbar spine 01/21/2019 FINDINGS: Prior MR not labeled with segment levels, though last described disc space in the report is L5-S1. Current exam labeled assuming presence of 5 lumbar vertebra. Cross-table lateral view 1110 hours. Metallic probes via dorsal approach project dorsal to the mid L3 and mid L5 levels. IMPRESSION: Dorsal localization of the mid L3 and mid L5 levels. Electronically Signed   By: Ulyses SouthwardMark  Boles M.D.   On: 02/25/2019 16:31   Dg C-arm 1-60 Min   Result Date: 02/25/2019 CLINICAL DATA:  Status post surgical posterior fusion of L4-5 and L5-S1. EXAM: LUMBAR SPINE - 2-3 VIEW; DG C-ARM 61-120 MIN FLUOROSCOPY TIME:  1 minutes 22 seconds. COMPARISON:  None. FINDINGS: Two intraoperative fluoroscopic images were obtained of the lower lumbar spine. These demonstrate the patient be status post surgical posterior fusion of L4-5 and L5-S1 with bilateral intrapedicular screw placement and interbody fusion. IMPRESSION: Fluoroscopic guidance provided during surgical posterior fusion of lower lumbar spine. Electronically Signed   By: Lupita RaiderJames  Green Jr  M.D.   On: 02/25/2019 16:56    Procedures Procedures (including critical care time)  Medications Ordered in ED Medications  ketorolac (TORADOL) 30 MG/ML injection 30 mg (has no administration in time range)  HYDROmorphone (DILAUDID) injection 1 mg (1 mg Intravenous Given 02/27/19 0834)     Initial Impression / Assessment and Plan / ED Course  I have reviewed the triage vital signs and the nursing notes.  Pertinent labs & imaging results that were available during my care of the patient were reviewed by me and considered in my medical decision making (see chart for details).  Clinical Course as of Feb 26 838  Fri Feb 27, 2019  0829 Covid screen 7/20 was negative   [JK]  0830 Pain somewhat better but she feels it coming back.   [JK]  7867 D/w PA Jefferey   [JK]    Clinical Course User Index [JK] Dorie Rank, MD  Patient presents to the ED for increasing postoperative back pain.  Patient initially did fine on the day of discharge last evening her pain started becoming more severe.  She was having spasms with any movement.  Patient was unable to get out of bed this morning.  She has normal sensation in her lower extremities movements.  I doubt any acute neurological complication.  Most likely related to muscle spasms associated with her surgery.  Patient's husband spoke with Dr. Lynann Bologna this morning.   Plan is for the patient to be admitted pain management of physical therapy.  Final Clinical Impressions(s) / ED Diagnoses   Final diagnoses:  Post-operative pain      Dorie Rank, MD 02/27/19 6720    Dorie Rank, MD 03/06/19 [phone]

## 2019-02-27 NOTE — Progress Notes (Signed)
Melissa Walsh was evaluated yesterday and had very minimal pain, and was getting about with no difficulty. I did speak with her last night regarding increasing back pain, and advised ice, rest, and increasing her dilaudid. Her pain sounded very consistent with postoperative muscle spasm. Her pain increased overnight, and I was informed this morning by [name] that she was being brought to ED. Patient will need to be admitted for IV pain medication and IV muscle relaxers as well as PT.

## 2019-02-27 NOTE — H&P (Signed)
Chief Complaint: Low back spasming  HPI: Melissa Walsh is a 53 y.o. female who was evaluated yesterday, on postoperative day #1, following her L4-S1 decompression and fusion.  She was doing very well with very minimal pain.  She presented to the emergency department today with increasing muscle spasming.  Please see my previous note for additional details.  Patient has failed multiple forms of conservative care and continues to have pain (see office notes for additional details regarding the patient's full course of treatment)  Past Medical History:  Diagnosis Date  . Anxiety   . Depression   . Thyroid disease    Past Surgical History:  Procedure Laterality Date  . PLACEMENT OF BREAST IMPLANTS    . TRANSFORAMINAL LUMBAR INTERBODY FUSION (TLIF) WITH PEDICLE SCREW FIXATION 2 LEVEL Left 02/25/2019   Procedure: LEFT-SIDED LUMBAR 4-5, LUMBAR 5- SACRUM 1  TRANSFORAMINAL LUMBAR INTERBODY FUSION WITH INSTRUMENTATION AND ALLOGRAFT;  Surgeon: Phylliss Bob, MD;  Location: [place];  Service: Orthopedics;  Laterality: Left;   Social History   Socioeconomic History  . Marital status: Married    Spouse name: Not on file  . Number of children: Not on file  . Years of education: Not on file  . Highest education level: Not on file  Occupational History  . Not on file  Social Needs  . Financial resource strain: Not on file  . Food insecurity    Worry: Not on file    Inability: Not on file  . Transportation needs    Medical: Not on file    Non-medical: Not on file  Tobacco Use  . Smoking status: Never Smoker  . Smokeless tobacco: Never Used  Substance and Sexual Activity  . Alcohol use: Not Currently  . Drug use: No  . Sexual activity: Not on file  Lifestyle  . Physical activity    Days per week: Not on file    Minutes per session: Not on file  . Stress: Not on file  Relationships  . Social Herbalist on phone: Not on file    Gets together: Not on file    Attends  religious service: Not on file    Active member of club or organization: Not on file    Attends meetings of clubs or organizations: Not on file    Relationship status: Not on file  Other Topics Concern  . Not on file  Social History Narrative  . Not on file   Family History  Problem Relation Age of Onset  . Alzheimer's disease Mother   . Healthy Father    No Known Allergies Prior to Admission medications   Medication Sig Start Date End Date Taking? Authorizing Provider  diazepam (VALIUM) 5 MG tablet Take 1 tablet (5 mg total) by mouth every 6 (six) hours as needed for muscle spasms. 02/26/19  Yes Phylliss Bob, MD  escitalopram (LEXAPRO) 20 MG tablet Take 20 mg by mouth daily.   Yes [provider]  gabapentin (NEURONTIN) 300 MG capsule Take 300 mg by mouth at bedtime. 02/09/19  Yes [provider]  HYDROmorphone (DILAUDID) 2 MG tablet Take 0.5 tablets (1 mg total) by mouth every 4 (four) hours as needed for severe pain. 02/26/19  Yes Kecia Swoboda, Elta Guadeloupe, MD  thyroid [facility] THYROID) 120 MG tablet Take 120 mg by mouth daily.    Yes [provider]  traMADol (ULTRAM) 50 MG tablet Take 50 mg by mouth every 8 (eight) hours as needed.  01/28/19  Yes [provider]  zolpidem (AMBIEN) 10 MG tablet Take 10 mg by mouth at bedtime as needed for sleep.   Yes [provider]  benzonatate (TESSALON) 200 MG capsule Take one cap TID PRN cough Patient not taking: Reported on 02/18/2019 01/24/17   Lutricia Feiloemer, William P, PA-C  chlorpheniramine-HYDROcodone [facility](TUSSIONEX PENNKINETIC ER) 10-8 MG/5ML SUER Take 5 mLs by mouth 2 (two) times daily. Patient not taking: Reported on 02/18/2019 01/24/17   Lutricia Feiloemer, William P, PA-C     All other systems have been reviewed and were otherwise negative with the exception of those mentioned in the HPI and as above.  Physical Exam: Vitals:   02/27/19 1316 02/27/19 1330  BP:  119/73  Pulse:  89  Resp:  17  Temp: 98.3 F (36.8 C) 98.4 F  (36.9 C)  SpO2:      There is no height or weight on file to calculate BMI.  General: Alert, no acute distress Cardiovascular: No pedal edema Respiratory: No cyanosis, no use of accessory musculature Skin: patient's bandages well applied, and is clean dry and intact Psychiatric: Patient is competent for consent with normal mood and affect Lymphatic: No axillary or cervical lymphadenopathy   Assessment/Plan: Increasing muscular spasming, as noted in my previous dictation.  We will treat with muscle relaxers and pain medication, and give her additional time to improve.   Jackelyn HoehnMark L Lory Nowaczyk, MD 02/27/2019 2:59 PM

## 2019-02-27 NOTE — ED Notes (Signed)
ED TO INPATIENT HANDOFF REPORT  ED Nurse Name and Phone #: Percell Locus, RN   S Name/Age/Gender Melissa Walsh 53 y.o. female Room/Bed: 012C/012C  Code Status   Code Status: Full Code  Home/SNF/Other Home Patient oriented to: self, place, time and situation Is this baseline? Yes   Triage Complete: Triage complete  Chief Complaint back spasm  Triage Note Pt BIB [facility] c/o worsening spasms. Pt reports spinal fusion of L4 L5 and S1 on Wednesday. Pt. Taking dilaudid and Valium at home. Last taken 4:45 am with no relief of spasms.   EMS VS BP 126/70 HR 105 98% RA   Allergies No Known Allergies  Level of Care/Admitting Diagnosis ED Disposition    ED Disposition Condition [facility] Area: [place] [[id number]]  Level of Care: Med-Surg [16]  Covid Evaluation: Confirmed COVID Negative  Diagnosis: Radiculopathy [532992]  Admitting Physician: [place], [place]  Attending Physician: [place], [place]  Estimated length of stay: past midnight tomorrow  Certification:: I certify this patient is being admitted for an inpatient-only procedure  Bed request comments: 3C  PT Class (Do Not Modify): Inpatient [101]  PT Acc Code (Do Not Modify): Private [1]       B Medical/Surgery History Past Medical History:  Diagnosis Date  . Anxiety   . Depression   . Thyroid disease    Past Surgical History:  Procedure Laterality Date  . PLACEMENT OF BREAST IMPLANTS    . TRANSFORAMINAL LUMBAR INTERBODY FUSION (TLIF) WITH PEDICLE SCREW FIXATION 2 LEVEL Left 02/25/2019   Procedure: LEFT-SIDED LUMBAR 4-5, LUMBAR 5- SACRUM 1  TRANSFORAMINAL LUMBAR INTERBODY FUSION WITH INSTRUMENTATION AND ALLOGRAFT;  Surgeon: Phylliss Bob, MD;  Location: [place];  Service: Orthopedics;  Laterality: Left;     A IV Location/Drains/Wounds Patient Lines/Drains/Airways Status   Active Line/Drains/Airways    Name:   Placement date:   Placement time:   Site:   Days:    Peripheral IV 02/27/19 Left Antecubital   02/27/19    0835    Antecubital   less than 1   Incision (Closed) 02/25/19 Back   02/25/19    1614     2          Intake/Output Last 24 hours No intake or output data in the 24 hours ending 02/27/19 1301  Labs/Imaging No results found for this or any previous visit (from the past 48 hour(s)). Dg Lumbar Spine 2-3 Views  Result Date: 02/25/2019 CLINICAL DATA:  Status post surgical posterior fusion of L4-5 and L5-S1. EXAM: LUMBAR SPINE - 2-3 VIEW; DG C-ARM 61-120 MIN FLUOROSCOPY TIME:  1 minutes 22 seconds. COMPARISON:  None. FINDINGS: Two intraoperative fluoroscopic images were obtained of the lower lumbar spine. These demonstrate the patient be status post surgical posterior fusion of L4-5 and L5-S1 with bilateral intrapedicular screw placement and interbody fusion. IMPRESSION: Fluoroscopic guidance provided during surgical posterior fusion of lower lumbar spine. Electronically Signed   By: Marijo Conception M.D.   On: 02/25/2019 16:56   Dg C-arm 1-60 Min  Result Date: 02/25/2019 CLINICAL DATA:  Status post surgical posterior fusion of L4-5 and L5-S1. EXAM: LUMBAR SPINE - 2-3 VIEW; DG C-ARM 61-120 MIN FLUOROSCOPY TIME:  1 minutes 22 seconds. COMPARISON:  None. FINDINGS: Two intraoperative fluoroscopic images were obtained of the lower lumbar spine. These demonstrate the patient be status post surgical posterior fusion of L4-5 and L5-S1 with bilateral intrapedicular screw placement and interbody fusion. IMPRESSION: Fluoroscopic guidance provided  during surgical posterior fusion of lower lumbar spine. Electronically Signed   By: Lupita RaiderJames  Green Jr M.D.   On: 02/25/2019 16:56    Pending Labs Unresulted Labs (From admission, onward)   None      Vitals/Pain Today's Vitals   02/27/19 0932 02/27/19 0945 02/27/19 1015 02/27/19 1043  BP:  103/70 112/70 118/71  Pulse:    100  Resp:    16  Temp:      SpO2:    98%  PainSc: 4        Isolation  Precautions No active isolations  Medications Medications  0.9 % NaCl with KCl 20 mEq/ L  infusion (has no administration in time range)  sodium chloride flush (NS) 0.9 % injection 3 mL (has no administration in time range)  sodium chloride flush (NS) 0.9 % injection 3 mL (has no administration in time range)  0.9 %  sodium chloride infusion (has no administration in time range)  ceFAZolin (ANCEF) IVPB 2g/100 mL premix (has no administration in time range)  acetaminophen (TYLENOL) tablet 650 mg (has no administration in time range)    Or  acetaminophen (TYLENOL) suppository 650 mg (has no administration in time range)  morphine 2 MG/ML injection 1-2 mg (has no administration in time range)  diazepam (VALIUM) tablet 5 mg (has no administration in time range)  zolpidem (AMBIEN) tablet 5 mg (has no administration in time range)  docusate sodium (COLACE) capsule 100 mg (has no administration in time range)  senna-docusate (Senokot-S) tablet 1 tablet (has no administration in time range)  bisacodyl (DULCOLAX) EC tablet 5 mg (has no administration in time range)  sodium phosphate (FLEET) 7-19 GM/118ML enema 1 enema (has no administration in time range)  ondansetron (ZOFRAN) tablet 4 mg (has no administration in time range)    Or  ondansetron (ZOFRAN) injection 4 mg (has no administration in time range)  alum & mag hydroxide-simeth (MAALOX/MYLANTA) 200-200-20 MG/5ML suspension 30 mL (has no administration in time range)  menthol-cetylpyridinium (CEPACOL) lozenge 3 mg (has no administration in time range)    Or  phenol (CHLORASEPTIC) mouth spray 1 spray (has no administration in time range)  methocarbamol (ROBAXIN) 500 mg in dextrose 5 % 50 mL IVPB (has no administration in time range)  HYDROmorphone (DILAUDID) tablet 1-2 mg (has no administration in time range)  HYDROmorphone (DILAUDID) injection 1 mg (1 mg Intravenous Given 02/27/19 0834)  ketorolac (TORADOL) 30 MG/ML injection 30 mg (30 mg  Intravenous Given 02/27/19 0838)    Mobility walks with device (back brace)  Moderate fall risk       R Recommendations: See Admitting Provider Note  Report given to:   Additional Notes:

## 2019-02-27 NOTE — Progress Notes (Signed)
Melissa Walsh was evaluated earlier this morning.  She did report feeling more comfortable this morning that she was last night, however, she did report ongoing intermittent significant spasming in the low back.  Her exam was otherwise unremarkable. As previously stated, we will get her admitted to the floor for physical therapy and medications until her severe spasming has subsided.

## 2019-02-28 MED ORDER — CYCLOBENZAPRINE HCL 10 MG PO TABS: 10.0000 mg | ORAL_TABLET | Freq: Three times a day (TID) | ORAL | 0 refills | Status: DC | PRN

## 2019-02-28 NOTE — Plan of Care (Signed)
Patient alert and oriented, mae's well, voiding adequate amount of urine, swallowing without difficulty, c/o soreness at time of discharge. Patient discharged home with family. Script sent to pharmacy and discharged instructions given to patient. Patient and family stated understanding of instructions given. Patient has an appointment with Dr. Lynann Bologna

## 2019-02-28 NOTE — Discharge Summary (Signed)
Patient ID: Melissa Walsh MRN: [id number] DOB/AGE: Dec 09, 1965 53 y.o.  Admit date: 02/27/2019 Discharge date: 02/28/2019  Admission Diagnoses:  Active Problems:   Radiculopathy Intractable low back pain with spasm status post L4 to the sacrum fusion on 02/25/2019.  Discharge Diagnoses:  Same  Past Medical History:  Diagnosis Date  . Anxiety   . Depression   . Thyroid disease     Surgeries: Lumbar fusion L4 to the sacrum on 02/25/2019   Consultants: Treatment Team:  Estill Bambergumonski, Mark, MD  Discharged Condition: Improved  Hospital Course: Melissa Walsh is an 53 y.o. female who was admitted 02/27/2019 for severe, unrelenting, intractable low back pain with associated spasm.  Patient has severe unremitting pain that affects sleep, daily activities, and work/hobbies.  She could not move, ambulate, or get out of bed.  She is admitted to the hospital through the emergency room with intractable low back pain postoperatively.  She did not have a fever.  She does have severe significant pain and spasm and inability to function.  She did not lose bladder or bowel function.  She is admitted for administration of IV muscle relaxers, pain medication, and physical therapy.  On 02/28/2019 patient was much improved and was ambulating in the room independently with her back brace on.  She had some back pain but no leg pain.  She was voiding without difficulty.  She is getting up to the bathroom independently at that point..    Patient was given perioperative antibiotics:  Anti-infectives (From admission, onward)   Start     Dose/Rate Route Frequency Ordered Stop   02/27/19 1300  ceFAZolin (ANCEF) IVPB 2g/100 mL premix  Status:  Discontinued     2 g 200 mL/hr over 30 Minutes Intravenous Every 8 hours 02/27/19 1255 02/27/19 1342       Patient was given sequential compression devices, early ambulation, and chemoprophylaxis to prevent DVT.  Patient benefited maximally from hospital stay and there were no  complications.    Recent vital signs:  Patient Vitals for the past 24 hrs:  BP Temp Temp src Pulse Resp SpO2  02/28/19 0730 - - - (!) 108 - 100 %  02/28/19 0730 121/73 98.1 F (36.7 C) Oral (!) 106 - 100 %  02/28/19 0350 117/76 98.2 F (36.8 C) Oral 94 18 100 %  02/27/19 2300 108/64 98.6 F (37 C) Oral (!) 110 20 100 %  02/27/19 2011 (!) 120/59 98.4 F (36.9 C) Oral (!) 107 18 99 %  02/27/19 1708 (!) 108/57 98.1 F (36.7 C) - 98 18 100 %  02/27/19 1330 119/73 98.4 F (36.9 C) Oral 89 17 -  02/27/19 1316 - 98.3 F (36.8 C) Oral - - -  02/27/19 1313 (!) 117/101 - - (!) 112 16 100 %  02/27/19 1100 109/75 - - 92 - 98 %  02/27/19 1043 118/71 - - 100 16 98 %     Recent laboratory studies: No results for input(s): WBC, HGB, HCT, PLT, NA, K, CL, CO2, BUN, CREATININE, GLUCOSE, INR, CALCIUM in the last 72 hours.  Invalid input(s): PT, 2   Discharge Medications:   Allergies as of 02/28/2019   No Known Allergies     Medication List    STOP taking these medications   benzonatate 200 MG capsule Commonly known as: TESSALON   chlorpheniramine-HYDROcodone 10-8 MG/5ML Suer Commonly known as: Tussionex Pennkinetic ER     TAKE these medications   Armour Thyroid 120 MG tablet Generic drug: thyroid Take 120  mg by mouth daily.   cyclobenzaprine 10 MG tablet Commonly known as: FLEXERIL Take 1 tablet (10 mg total) by mouth 3 (three) times daily as needed for muscle spasms.   diazepam 5 MG tablet Commonly known as: VALIUM Take 1 tablet (5 mg total) by mouth every 6 (six) hours as needed for muscle spasms.   escitalopram 20 MG tablet Commonly known as: LEXAPRO Take 20 mg by mouth daily.   gabapentin 300 MG capsule Commonly known as: NEURONTIN Take 300 mg by mouth at bedtime.   HYDROmorphone 2 MG tablet Commonly known as: DILAUDID Take 0.5 tablets (1 mg total) by mouth every 4 (four) hours as needed for severe pain.   traMADol 50 MG tablet Commonly known as: ULTRAM Take 50  mg by mouth every 8 (eight) hours as needed.   zolpidem 10 MG tablet Commonly known as: AMBIEN Take 10 mg by mouth at bedtime as needed for sleep.       Diagnostic Studies: Dg Lumbar Spine 2-3 Views  Result Date: 02/25/2019 CLINICAL DATA:  Status post surgical posterior fusion of L4-5 and L5-S1. EXAM: LUMBAR SPINE - 2-3 VIEW; DG C-ARM 61-120 MIN FLUOROSCOPY TIME:  1 minutes 22 seconds. COMPARISON:  None. FINDINGS: Two intraoperative fluoroscopic images were obtained of the lower lumbar spine. These demonstrate the patient be status post surgical posterior fusion of L4-5 and L5-S1 with bilateral intrapedicular screw placement and interbody fusion. IMPRESSION: Fluoroscopic guidance provided during surgical posterior fusion of lower lumbar spine. Electronically Signed   By: Lupita RaiderJames  Green Jr M.D.   On: 02/25/2019 16:56   Dg Lumbar Spine 1 View  Result Date: 02/25/2019 CLINICAL DATA:  Lumbar surgery L4-L5 and L5-S1 EXAM: LUMBAR SPINE - 1 VIEW COMPARISON:  MRI lumbar spine 01/21/2019 FINDINGS: Prior MR not labeled with segment levels, though last described disc space in the report is L5-S1. Current exam labeled assuming presence of 5 lumbar vertebra. Cross-table lateral view 1110 hours. Metallic probes via dorsal approach project dorsal to the mid L3 and mid L5 levels. IMPRESSION: Dorsal localization of the mid L3 and mid L5 levels. Electronically Signed   By: Ulyses SouthwardMark  Boles M.D.   On: 02/25/2019 16:31   Dg C-arm 1-60 Min  Result Date: 02/25/2019 CLINICAL DATA:  Status post surgical posterior fusion of L4-5 and L5-S1. EXAM: LUMBAR SPINE - 2-3 VIEW; DG C-ARM 61-120 MIN FLUOROSCOPY TIME:  1 minutes 22 seconds. COMPARISON:  None. FINDINGS: Two intraoperative fluoroscopic images were obtained of the lower lumbar spine. These demonstrate the patient be status post surgical posterior fusion of L4-5 and L5-S1 with bilateral intrapedicular screw placement and interbody fusion. IMPRESSION: Fluoroscopic guidance  provided during surgical posterior fusion of lower lumbar spine. Electronically Signed   By: Lupita RaiderJames  Green Jr M.D.   On: 02/25/2019 16:56    Disposition: Discharge disposition: 01-Home or Self Care       Discharge Instructions    Call MD / Call 911   Complete by: As directed    If you experience chest pain or shortness of breath, CALL 911 and be transported to the hospital emergency room.  If you develope a fever above 101 F, pus (white drainage) or increased drainage or redness at the wound, or calf pain, call your surgeon's office.   Constipation Prevention   Complete by: As directed    Drink plenty of fluids.  Prune juice may be helpful.  You may use a stool softener, such as Colace (over the counter) 100 mg twice a day.  Use MiraLax (over the counter) for constipation as needed.   Diet general   Complete by: As directed    Increase activity slowly as tolerated   Complete by: As directed    Wear back brace as directed.    She had Dilaudid tablets at home for pain as well as Valium 5 mg as needed for anxiety and spasm.  An additional Rx for Flexeril 10 mg was sent in to her pharmacy 1 every 8 hours as needed for spasm. The patient will wear her back brace as directed.  She will ambulate as tolerated.  We will follow-up with Dr. Lynann Bologna in 10 to 14 days in the office.  She will call sooner if she has any difficulties.     Signed: Erlene Senters 02/28/2019, 10:34 AM

## 2019-02-28 NOTE — Evaluation (Signed)
Occupational Therapy Evaluation Patient Details Name: Melissa Walsh MRN: [id number] DOB: 11-16-1965 Today's Date: 02/28/2019    History of Present Illness Patient s/p LEFT-SIDED LUMBAR 4-5, LUMBAR 5- SACRUM 1  TRANSFORAMINAL LUMBAR INTERBODY FUSION on 02/25/2019.Marland Kitchen She was re-admitted on 02/27/2019 with significant low back pain. Today she is feeling better.    Clinical Impression   Patient re-admitted for pain management after back surgery.  Patient completing transfers and mobility without AD, modified independently.  Reviewed back precautions and ADL compensatory techniques, pt able to verbalize and demonstrate techniques without difficulty at modified independent level.  Reviewed use of ice and positional changes, in addition to pain medications prescribed, for pain mgmt.  No further questions at this time and no further OT needs identified.  OT signing off.     Follow Up Recommendations  No OT follow up;Supervision - Intermittent    Equipment Recommendations  None recommended by OT    Recommendations for Other Services       Precautions / Restrictions Precautions Precautions: Back;Fall Precaution Booklet Issued: Yes (comment) Precaution Comments: Pt able to independently recall 3/3 back precautions at start of evaluation. Required Braces or Orthoses: Spinal Brace Spinal Brace: Thoracolumbosacral orthotic;Applied in standing position Restrictions Weight Bearing Restrictions: No      Mobility Bed Mobility Overal bed mobility: Needs Assistance Bed Mobility: Rolling;Sidelying to Sit Rolling: Supervision Sidelying to sit: Supervision       General bed mobility comments: no assist required  Transfers Overall transfer level: Modified independent Equipment used: None Transfers: Sit to/from Stand Sit to Stand: Modified independent (Device/Increase time)         General transfer comment: slow and guarded, but no assist required    Balance Overall balance assessment:  Modified Independent                                         ADL either performed or assessed with clinical judgement   ADL Overall ADL's : Modified independent                                       General ADL Comments: patient demostrates ability to use compensatory techniques (taught earlier this week) to complete UB/LB dressing without assistance, using figure 4 technique to adhere to precautions.      Vision         Perception     Praxis      Pertinent Vitals/Pain Pain Assessment: 0-10 Pain Score: 7 (improving with mobility) Pain Location: back Pain Descriptors / Indicators: Discomfort;Operative site guarding Pain Intervention(s): Limited activity within patient's tolerance;Monitored during session;Repositioned     Hand Dominance     Extremity/Trunk Assessment Upper Extremity Assessment Upper Extremity Assessment: Overall WFL for tasks assessed   Lower Extremity Assessment Lower Extremity Assessment: Defer to PT evaluation LLE Deficits / Details: reported functional weakness in left leg MMT not tested.    Cervical / Trunk Assessment Cervical / Trunk Assessment: Other exceptions Cervical / Trunk Exceptions: s/p spinal surgery   Communication Communication Communication: No difficulties   Cognition Arousal/Alertness: Awake/alert Behavior During Therapy: WFL for tasks assessed/performed Overall Cognitive Status: Within Functional Limits for tasks assessed  General Comments  reviewed use of positional changes and ice for pain mgmt     Exercises     Shoulder Instructions      Home Living Family/patient expects to be discharged to:: Private residence Living Arrangements: Spouse/significant other Available Help at Discharge: Family;Available 24 hours/day Type of Home: (camper) Home Access: Stairs to enter Entrance Stairs-Number of Steps: 5   Home Layout: One level      Bathroom Shower/Tub: Producer, television/film/videoWalk-in shower   Bathroom Toilet: Standard     Home Equipment: Information systems managerhower seat - built in          Prior Functioning/Environment Level of Independence: Independent        Comments: work as a Charity fundraiserN in [place]        OT Problem List: Pain      OT Treatment/Interventions:      OT Goals(Current goals can be found in the care plan section) Acute Rehab OT Goals Patient Stated Goal: go home today OT Goal Formulation: With patient  OT Frequency:     Barriers to D/C:            Co-evaluation              AM-PAC OT "6 Clicks" Daily Activity     Outcome Measure Help from another person eating meals?: None Help from another person taking care of personal grooming?: None Help from another person toileting, which includes using toliet, bedpan, or urinal?: None Help from another person bathing (including washing, rinsing, drying)?: None Help from another person to put on and taking off regular upper body clothing?: None Help from another person to put on and taking off regular lower body clothing?: None 6 Click Score: 24   End of Session Equipment Utilized During Treatment: Back brace;Gait belt Nurse Communication: Mobility status  Activity Tolerance: Patient tolerated treatment well Patient left: in chair;with call bell/phone within reach  OT Visit Diagnosis: Pain Pain - part of body: (back- incisonal)                Time: 1610-9604: 0825-0844 OT Time Calculation (min): 19 min Charges:  OT General Charges $OT Visit: 1 Visit OT Evaluation $OT Eval Low Complexity: 1 Low  Chancy Milroyhristie S Peyton Rossner, OT Acute Rehabilitation Services Pager [phone] Office [phone]   Chancy MilroyChristie S Devona Holmes 02/28/2019, 10:45 AM

## 2019-02-28 NOTE — Evaluation (Signed)
Physical Therapy Evaluation Patient Details Name: Melissa Walsh MRN: [id number] DOB: 1966/08/06 Today's Date: 02/28/2019   History of Present Illness  Patient s/p LEFT-SIDED LUMBAR 4-5, LUMBAR 5- SACRUM 1  TRANSFORAMINAL LUMBAR INTERBODY FUSION on 02/25/2019.Marland Kitchen She was re-admitted on 02/27/2019 with significant low back pain. Today she is feeling better.   Clinical Impression  Patient is slowed and has some pain but she was Independent with all mobility tasks. She had recently walked with OT. Therapy reviewed her precautions which she was bale to state without cuing. She has no need for skilled therapy at this time.     Follow Up Recommendations Follow surgeon's recommendation for DC plan and follow-up therapies;No PT follow up    Equipment Recommendations  None recommended by PT    Recommendations for Other Services       Precautions / Restrictions Precautions Precautions: Back;Fall Precaution Booklet Issued: Yes (comment) Precaution Comments: Pt able to independently recall 3/3 back precautions at start of evaluation. Required Braces or Orthoses: Spinal Brace Spinal Brace: Thoracolumbosacral orthotic;Applied in standing position Restrictions Weight Bearing Restrictions: No      Mobility  Bed Mobility               General bed mobility comments: found sitting in a chair. Able to re-state how to do log roll   Transfers Overall transfer level: Modified independent   Transfers: Sit to/from Stand Sit to Stand: Modified independent (Device/Increase time)         General transfer comment: slow transfer from sit to stand but able to do   Ambulation/Gait Ambulation/Gait assistance: Supervision Gait Distance (Feet): 15 Feet Assistive device: None Gait Pattern/deviations: Step-through pattern Gait velocity: decreased   General Gait Details: Patient had just ambualted 150' with OT 20 minutes beofre PT entered. patient walked to the door and back   Marine scientist Rankin (Stroke Patients Only)       Balance Overall balance assessment: Modified Independent                                           Pertinent Vitals/Pain Pain Assessment: 0-10 Pain Score: 6  Pain Location: back Pain Descriptors / Indicators: Discomfort Pain Intervention(s): Limited activity within patient's tolerance;Monitored during session    Home Living Family/patient expects to be discharged to:: Private residence Living Arrangements: Spouse/significant other Available Help at Discharge: Family;Available 24 hours/day Type of Home: (camper) Home Access: Stairs to enter   Entrance Stairs-Number of Steps: 5 Home Layout: One level Home Equipment: None      Prior Function Level of Independence: Independent         Comments: work as a Therapist, sports in IT consultant        Extremity/Trunk Assessment   Upper Extremity Assessment Upper Extremity Assessment: Defer to OT evaluation    Lower Extremity Assessment Lower Extremity Assessment: LLE deficits/detail LLE Deficits / Details: reported functional weakness in left leg MMT not tested.        Communication   Communication: No difficulties  Cognition Arousal/Alertness: Awake/alert Behavior During Therapy: WFL for tasks assessed/performed Overall Cognitive Status: Within Functional Limits for tasks assessed  General Comments General comments (skin integrity, edema, etc.): revewed soft tissue mobilization to surrounding muscles by herself or her husband if she starts to feel pain increasing. Advised to stay away from incision site     Exercises     Assessment/Plan    PT Assessment Patent does not need any further PT services  PT Problem List         PT Treatment Interventions      PT Goals (Current goals can be found in the Care Plan section)  Acute Rehab PT Goals Patient Stated Goal: go  home today PT Goal Formulation: With patient Time For Goal Achievement: 03/12/19 Potential to Achieve Goals: Good    Frequency     Barriers to discharge        Co-evaluation               AM-PAC PT "6 Clicks" Mobility  Outcome Measure Help needed turning from your back to your side while in a flat bed without using bedrails?: A Little Help needed moving from lying on your back to sitting on the side of a flat bed without using bedrails?: A Little Help needed moving to and from a bed to a chair (including a wheelchair)?: A Little Help needed standing up from a chair using your arms (e.g., wheelchair or bedside chair)?: A Little Help needed to walk in hospital room?: A Little Help needed climbing 3-5 steps with a railing? : A Little 6 Click Score: 18    End of Session Equipment Utilized During Treatment: Back brace Activity Tolerance: Patient tolerated treatment well Patient left: in bed;with call bell/phone within reach Nurse Communication: Mobility status PT Visit Diagnosis: Unsteadiness on feet (R26.81);Pain Pain - part of body: (back)    Time: [id number] PT Time Calculation (min) (ACUTE ONLY): 16 min   Charges:   PT Evaluation $PT Eval Low Complexity: 1 Low            Dessie Comaavid J Yuridiana Formanek PT DPT   02/28/2019, 10:03 AM

## 2019-02-28 NOTE — Progress Notes (Signed)
Subjective:   The patient is status post L4 to the sacrum fusion on 02/25/2019.  She came back to the hospital with severe low back pain and spasm.  No significant leg pain.  She was unable to control it at home with oral medication.  She was admitted back to the hospital for administration of muscle relaxers and pain medication.  Physical therapy was also ordered.  She reports today that she still has back pain without significant leg pain.  It is much improved.  She is ambulating independently in her room.  She is voiding without difficulty.  She is passing gas.  She reports that she feels well enough to be discharged home. Patient reports pain as moderate.  She would like to try different muscle relaxer.  Objective: Vital signs in last 24 hours: Temp:  [98.1 F (36.7 C)-98.6 F (37 C)] 98.1 F (36.7 C) (07/25 0730) Pulse Rate:  [89-112] 108 (07/25 0730) Resp:  [16-20] 18 (07/25 0350) BP: (108-121)/(57-101) 121/73 (07/25 0730) SpO2:  [98 %-100 %] 100 % (07/25 0730)  Intake/Output from previous day: 07/24 0701 - 07/25 0700 In: 6 [I.V.:6] Out: -  Intake/Output this shift: No intake/output data recorded.  No results for input(s): HGB in the last 72 hours. No results for input(s): WBC, RBC, HCT, PLT in the last 72 hours. No results for input(s): NA, K, CL, CO2, BUN, CREATININE, GLUCOSE, CALCIUM in the last 72 hours. No results for input(s): LABPT, INR in the last 72 hours. Lumbar spine exam: Her lumbosacral orthosis is fitting well.  Her lumbar dressings are clean and dry.  She has normal neurovascular status in both lower extremities.  No motor weakness.  The patient is observed ambulating in the room without significant difficulty other than some low back pain.    Assessment/Plan:   1.  Acute postoperative low back pain with associated spasm.  Intractable pain.  Now improving with IV medication.  She is status post L4 to the sacrum fusion on 02/25/2019.  Plan: She can be discharged  home today.  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  We will send an Rx for cyclobenzaprine 10 mg #40 1 every 8 hours as needed for spasm.  She has Dilaudid 1 mg tabs at home.  She is instructed to take 2 of these every 4-6 hours as needed.  She can also use the Valium as needed. She is encouraged to continue to wear the back brace.  She will follow-up with Dr. Lynann Bologna in 2 weeks.   Erlene Senters 02/28/2019, 10:21 AM

## 2019-03-03 MED FILL — Sodium Chloride IV Soln 0.9%: INTRAVENOUS | Qty: 1000 | Status: AC

## 2019-03-03 MED FILL — Heparin Sodium (Porcine) Inj 1000 Unit/ML: INTRAMUSCULAR | Qty: 30 | Status: AC

## 2019-03-04 ENCOUNTER — Encounter (HOSPITAL_COMMUNITY): Payer: Self-pay | Admitting: Orthopedic Surgery

## 2019-03-04 NOTE — Discharge Summary (Signed)
Patient ID: Melissa Walsh MRN: [id number] DOB/AGE: 06-May-1966 53 y.o.  Admit date: 02/25/2019 Discharge date: 02/26/2019  Admission Diagnoses:  Active Problems:   Radiculopathy   Discharge Diagnoses:  Same  Past Medical History:  Diagnosis Date  . Anxiety   . Depression   . Thyroid disease     Surgeries: Procedure(s): LEFT-SIDED LUMBAR FOUR-FIVE, LUMBAR FIVE- SACRUM ONE  TRANSFORAMINAL LUMBAR INTERBODY FUSION WITH INSTRUMENTATION AND ALLOGRAFT on 02/25/2019   Consultants: None  Discharged Condition: Improved  Hospital Course: Melissa LightsBeverly Homan is an 53 y.o. female who was admitted 02/25/2019 for operative treatment of radiculopathy. Patient has severe unremitting pain that affects sleep, daily activities, and work/hobbies. After pre-op clearance the patient was taken to the operating room on 02/25/2019 and underwent  Procedure(s): LEFT-SIDED LUMBAR FOUR-FIVE, LUMBAR FIVE- SACRUM ONE  TRANSFORAMINAL LUMBAR INTERBODY FUSION WITH INSTRUMENTATION AND ALLOGRAFT.    Patient was given perioperative antibiotics:  Anti-infectives (From admission, onward)   Start     Dose/Rate Route Frequency Ordered Stop   02/25/19 1900  ceFAZolin (ANCEF) IVPB 2g/100 mL premix     2 g 200 mL/hr over 30 Minutes Intravenous Every 8 hours 02/25/19 1846 02/26/19 1200   02/25/19 0800  ceFAZolin (ANCEF) IVPB 2g/100 mL premix     2 g 200 mL/hr over 30 Minutes Intravenous On call to O.R. 02/25/19 [id number] 02/25/19 1442       Patient was given sequential compression devices, early ambulation to prevent DVT.  Patient benefited maximally from hospital stay and there were no complications.    Recent vital signs: BP 116/70 (BP Location: Right Arm)   Pulse 88   Temp 98.1 F (36.7 C) (Oral)   Resp 18   Ht 5\' 2"  (1.575 m)   Wt 58.1 kg   SpO2 100%   BMI 23.41 kg/m    Discharge Medications:   Allergies as of 02/26/2019   No Known Allergies     Medication List    TAKE these medications   [facility]our Thyroid  120 MG tablet Generic drug: thyroid Take 120 mg by mouth daily.   diazepam 5 MG tablet Commonly known as: VALIUM Take 1 tablet (5 mg total) by mouth every 6 (six) hours as needed for muscle spasms.   escitalopram 20 MG tablet Commonly known as: LEXAPRO Take 20 mg by mouth daily.   HYDROmorphone 2 MG tablet Commonly known as: DILAUDID Take 0.5 tablets (1 mg total) by mouth every 4 (four) hours as needed for severe pain.   zolpidem 10 MG tablet Commonly known as: AMBIEN Take 10 mg by mouth at bedtime as needed for sleep.       Diagnostic Studies: Dg Lumbar Spine 2-3 Views  Result Date: 02/25/2019 CLINICAL DATA:  Status post surgical posterior fusion of L4-5 and L5-S1. EXAM: LUMBAR SPINE - 2-3 VIEW; DG C-ARM 61-120 MIN FLUOROSCOPY TIME:  1 minutes 22 seconds. COMPARISON:  None. FINDINGS: Two intraoperative fluoroscopic images were obtained of the lower lumbar spine. These demonstrate the patient be status post surgical posterior fusion of L4-5 and L5-S1 with bilateral intrapedicular screw placement and interbody fusion. IMPRESSION: Fluoroscopic guidance provided during surgical posterior fusion of lower lumbar spine. Electronically Signed   By: Lupita RaiderJames  Green Jr M.D.   On: 02/25/2019 16:56   Dg Lumbar Spine 1 View  Result Date: 02/25/2019 CLINICAL DATA:  Lumbar surgery L4-L5 and L5-S1 EXAM: LUMBAR SPINE - 1 VIEW COMPARISON:  MRI lumbar spine 01/21/2019 FINDINGS: Prior MR not labeled with segment levels, though last  described disc space in the report is L5-S1. Current exam labeled assuming presence of 5 lumbar vertebra. Cross-table lateral view 1110 hours. Metallic probes via dorsal approach project dorsal to the mid L3 and mid L5 levels. IMPRESSION: Dorsal localization of the mid L3 and mid L5 levels. Electronically Signed   By: Lavonia Dana M.D.   On: 02/25/2019 16:31   Dg C-arm 1-60 Min  Result Date: 02/25/2019 CLINICAL DATA:  Status post surgical posterior fusion of L4-5 and L5-S1.  EXAM: LUMBAR SPINE - 2-3 VIEW; DG C-ARM 61-120 MIN FLUOROSCOPY TIME:  1 minutes 22 seconds. COMPARISON:  None. FINDINGS: Two intraoperative fluoroscopic images were obtained of the lower lumbar spine. These demonstrate the patient be status post surgical posterior fusion of L4-5 and L5-S1 with bilateral intrapedicular screw placement and interbody fusion. IMPRESSION: Fluoroscopic guidance provided during surgical posterior fusion of lower lumbar spine. Electronically Signed   By: Marijo Conception M.D.   On: 02/25/2019 16:56    Disposition:    POD #1 s/p L4-S1 decompression and fusion, doing well -Scripts for pain sent to pharmacy electronically  -D/C instructions sheet printed and in chart -D/C today  -F/U in office 2 weeks   Signed: Lennie Muckle Leeandra Ellerson 03/04/2019, 11:33 AM

## 2019-06-02 ENCOUNTER — Other Ambulatory Visit: Payer: Self-pay | Admitting: Orthopedic Surgery

## 2019-06-02 DIAGNOSIS — M533 Sacrococcygeal disorders, not elsewhere classified: Secondary | ICD-10-CM

## 2019-06-09 ENCOUNTER — Ambulatory Visit
Admission: RE | Admit: 2019-06-09 | Discharge: 2019-06-09 | Disposition: A | Discharge status: Home / Self Care | Payer: BC Managed Care – PPO | Source: Ambulatory Visit | Attending: Orthopedic Surgery | Admitting: Orthopedic Surgery

## 2019-06-09 ENCOUNTER — Other Ambulatory Visit: Payer: Self-pay

## 2019-06-09 DIAGNOSIS — M533 Sacrococcygeal disorders, not elsewhere classified: Secondary | ICD-10-CM

## 2019-06-09 MED ORDER — METHYLPREDNISOLONE ACETATE 40 MG/ML INJ SUSP (RADIOLOG
120.0000 mg | Freq: Once | INTRAMUSCULAR | Status: AC
  Administered 2019-06-09: 120 mg via INTRA_ARTICULAR

## 2019-09-08 ENCOUNTER — Ambulatory Visit (INDEPENDENT_AMBULATORY_CARE_PROVIDER_SITE_OTHER): Payer: Managed Care, Other (non HMO) | Admitting: Podiatry

## 2019-09-08 ENCOUNTER — Other Ambulatory Visit: Payer: Self-pay

## 2019-09-08 ENCOUNTER — Ambulatory Visit (INDEPENDENT_AMBULATORY_CARE_PROVIDER_SITE_OTHER): Payer: Managed Care, Other (non HMO)

## 2019-09-08 DIAGNOSIS — M216X9 Other acquired deformities of unspecified foot: Secondary | ICD-10-CM | POA: Diagnosis not present

## 2019-09-08 DIAGNOSIS — M79672 Pain in left foot: Secondary | ICD-10-CM

## 2019-09-08 DIAGNOSIS — M216X2 Other acquired deformities of left foot: Secondary | ICD-10-CM

## 2019-09-08 NOTE — Progress Notes (Signed)
   HPI: 54 y.o. female presenting today for evaluation of constant left foot pain that is been going on for approximately the past 7 months.  Patient has a history of a spinal fusion this past year and since undergoing spinal fusion she has noticed that she has pain with long periods of standing.  She says that she puts all of her pressure on the outside of her foot.  She has tried over-the-counter insoles and spent a significant amount of money buying different shoes which have not significantly improved her symptoms.  Aggravated by walking and long periods of standing.  She presents for further treatment evaluation  Past Medical History:  Diagnosis Date  . Anxiety   . Depression   . Thyroid disease      Physical Exam: General: The patient is alert and oriented x3 in no acute distress.  Dermatology: Skin is warm, dry and supple bilateral lower extremities. Negative for open lesions or macerations.  Vascular: Palpable pedal pulses bilaterally. No edema or erythema noted. Capillary refill within normal limits.  Neurological: Epicritic and protective threshold grossly intact bilaterally.   Musculoskeletal Exam: Range of motion within normal limits to all pedal and ankle joints bilateral. Muscle strength 5/5 in all groups bilateral.  Significant pain on palpation to the lateral column of the left foot.  During weightbearing and resting stance she does contribute to majority of her body weight to the lateral columns of her feet bilateral  Radiographic Exam:  Normal osseous mineralization. Joint spaces preserved. No fracture/dislocation/boney destruction.    Assessment: 1.  Excessive lateral column loading of the feet bilateral 2.  Capsulitis left foot   Plan of Care:  1. Patient evaluated. X-Rays reviewed.  2.  I explained the patient this is more so of a biomechanical deformity.  At this point I do recommend custom molded orthotics as her best option for long-term alleviation of her  symptoms 3.  Appointment with Raiford Noble, Pedorthist, for custom molded orthotics 4.  Return to clinic as needed      Felecia Shelling, DPM [facility]  Dr. Felecia Shelling, DPM    [address] [place], [place] [address]                Office [phone]  Fax [phone]

## 2019-09-15 ENCOUNTER — Other Ambulatory Visit: Payer: Self-pay

## 2019-09-15 ENCOUNTER — Other Ambulatory Visit: Payer: Managed Care, Other (non HMO) | Admitting: Orthotics

## 2019-09-15 DIAGNOSIS — M216X2 Other acquired deformities of left foot: Secondary | ICD-10-CM

## 2019-09-15 DIAGNOSIS — M216X1 Other acquired deformities of right foot: Secondary | ICD-10-CM

## 2019-09-16 ENCOUNTER — Ambulatory Visit (INDEPENDENT_AMBULATORY_CARE_PROVIDER_SITE_OTHER): Payer: Managed Care, Other (non HMO)

## 2019-09-16 ENCOUNTER — Encounter: Payer: Self-pay | Admitting: Orthopaedic Surgery

## 2019-09-16 ENCOUNTER — Ambulatory Visit (INDEPENDENT_AMBULATORY_CARE_PROVIDER_SITE_OTHER): Payer: Managed Care, Other (non HMO) | Admitting: Orthopaedic Surgery

## 2019-09-16 VITALS — Ht 62.0 in | Wt 113.0 lb

## 2019-09-16 DIAGNOSIS — M25552 Pain in left hip: Secondary | ICD-10-CM

## 2019-09-16 MED ORDER — TRAMADOL HCL 50 MG PO TABS: 50.0000 mg | ORAL_TABLET | Freq: Every day | ORAL | 2 refills | Status: DC | PRN

## 2019-09-16 NOTE — Progress Notes (Signed)
Office Visit Note   Patient: Melissa Walsh           Date of Birth: [birth year]           MRN: [id number] Visit Date: 09/16/2019              Requested by: No referring provider defined for this encounter. PCP: System, Pcp Not In   Assessment & Plan: Visit Diagnoses:  1. Pain in left hip     Plan: My impression is left lateral hip pain suspect trochanter bursitis versus abductor tendinosis.  Based on discussion she would like to try home exercises for stretching and strengthening.  She will continue with NSAIDs as needed.  Small prescription of tramadol was sent in for breakthrough pain.  She declined cortisone injection today.  Follow-Up Instructions: Return if symptoms worsen or fail to improve.   Orders:  Orders Placed This Encounter  Procedures  . XR HIP UNILAT W OR W/O PELVIS 2-3 VIEWS LEFT   Meds ordered this encounter  Medications  . traMADol (ULTRAM) 50 MG tablet    Sig: Take 1-2 tablets (50-100 mg total) by mouth daily as needed.    Dispense:  20 tablet    Refill:  2      Procedures: No procedures performed   Clinical Data: No additional findings.   Subjective: Chief Complaint  Patient presents with  . Left Hip - Pain    Melissa Walsh is a 54 year old female who comes in for evaluation of lateral left hip pain for approximately the last 9 months.  She states that the pain started after she had her back fusion surgery in July 2020.  She denies any groin pain or radicular symptoms.  She is also being worked up for lateral foot pain by Dr. Victorino Dike.  She denies any injuries to the left hip.   Review of Systems  Constitutional: Negative.   HENT: Negative.   Eyes: Negative.   Respiratory: Negative.   Cardiovascular: Negative.   Endocrine: Negative.   Musculoskeletal: Negative.   Neurological: Negative.   Hematological: Negative.   Psychiatric/Behavioral: Negative.   All other systems reviewed and are negative.    Objective: Vital Signs: Ht 5\' 2"  (1.575  m)   Wt 113 lb (51.3 kg)   BMI 20.67 kg/m   Physical Exam Vitals and nursing note reviewed.  Constitutional:      Appearance: She is well-developed.  HENT:     Head: Normocephalic and atraumatic.  Pulmonary:     Effort: Pulmonary effort is normal.  Abdominal:     Palpations: Abdomen is soft.  Musculoskeletal:     Cervical back: Neck supple.  Skin:    General: Skin is warm.     Capillary Refill: Capillary refill takes less than 2 seconds.  Neurological:     Mental Status: She is alert and oriented to person, place, and time.  Psychiatric:        Behavior: Behavior normal.        Thought Content: Thought content normal.        Judgment: Judgment normal.     Ortho Exam Left hip exam shows full painless range of motion.  She is tender along the greater trochanter.  She has no pain with logroll straight leg.  No pain with deep flexion of the hip.  Mild to moderate pain with resisted hip abduction and Ober test.   Specialty Comments:  No specialty comments available.  Imaging: XR HIP UNILAT W OR W/O PELVIS 2-3  VIEWS LEFT  Result Date: 09/16/2019 No acute or structure abnormalities.    PMFS History: Patient Active Problem List   Diagnosis Date Noted  . Radiculopathy 02/25/2019   Past Medical History:  Diagnosis Date  . Anxiety   . Depression   . Thyroid disease     Family History  Problem Relation Age of Onset  . Alzheimer's disease Mother   . Healthy Father     Past Surgical History:  Procedure Laterality Date  . PLACEMENT OF BREAST IMPLANTS    . TRANSFORAMINAL LUMBAR INTERBODY FUSION (TLIF) WITH PEDICLE SCREW FIXATION 2 LEVEL Left 02/25/2019   Procedure: LEFT-SIDED LUMBAR FOUR-FIVE, LUMBAR FIVE- SACRUM ONE  TRANSFORAMINAL LUMBAR INTERBODY FUSION WITH INSTRUMENTATION AND ALLOGRAFT;  Surgeon: Phylliss Bob, MD;  Location: [facility];  Service: Orthopedics;  Laterality: Left;   Social History   Occupational History  . Not on file  Tobacco Use  . Smoking status:  Never Smoker  . Smokeless tobacco: Never Used  Substance and Sexual Activity  . Alcohol use: Not Currently  . Drug use: No  . Sexual activity: Not on file

## 2019-09-18 ENCOUNTER — Telehealth: Payer: Self-pay | Admitting: Orthotics

## 2019-09-18 NOTE — Telephone Encounter (Signed)
Pt returned call and will call when she isable to come in to be recast for the orthotics

## 2019-09-29 ENCOUNTER — Other Ambulatory Visit: Payer: Self-pay | Admitting: Podiatry

## 2019-09-29 DIAGNOSIS — M216X2 Other acquired deformities of left foot: Secondary | ICD-10-CM

## 2019-10-23 ENCOUNTER — Other Ambulatory Visit: Payer: Self-pay

## 2019-10-23 ENCOUNTER — Ambulatory Visit: Payer: Managed Care, Other (non HMO) | Admitting: Orthotics

## 2019-10-23 DIAGNOSIS — M216X9 Other acquired deformities of unspecified foot: Secondary | ICD-10-CM

## 2019-10-23 DIAGNOSIS — M79672 Pain in left foot: Secondary | ICD-10-CM

## 2019-10-23 NOTE — Progress Notes (Signed)
Add ff and rf varus wedging to help foot from rolling out.

## 2020-12-21 ENCOUNTER — Other Ambulatory Visit: Payer: Self-pay

## 2020-12-21 ENCOUNTER — Ambulatory Visit (INDEPENDENT_AMBULATORY_CARE_PROVIDER_SITE_OTHER): Payer: Self-pay | Admitting: Plastic Surgery

## 2020-12-21 DIAGNOSIS — Z411 Encounter for cosmetic surgery: Secondary | ICD-10-CM

## 2020-12-21 NOTE — Progress Notes (Signed)
Patient presents to discuss nonsurgical facial rejuvenation.  She is interested in Botox for the forehead and glabella and is interesting in improvement in her nasolabial folds.  We discussed the risks and benefits of Botox and filler treatment and she is fully understanding and wants to proceed.  Face was prepped with an alcohol pad and 32 units of Botox were distributed throughout the forehead and glabella.  I then injected 1 cc of Restylane define in the bilateral nasolabial folds.  I took care to do this superficially inferiorly and went quite deep superiorly approaching the alar base to avoid the vessels.  She tolerated this well.  We will plan to see her again on an as-needed basis.

## 2021-08-21 IMAGING — CT CT BIOPSY
1 of 5 series · 9 of 32 positions shown, 15 images · non-contrast
Comparison: none

CLINICAL DATA: Worsening left hip and buttock pain with leg
numbness to the foot. Suspected left sacroiliac joint dysfunction.
Prior L4-S1 fusion in [REDACTED].

[Series 2: needle -guided injection (person_name) · axial · 0.67mm/px · z∈[-122,-42]mm · 9 of 51 slices shown, 15 images]
[im 6/51  soft-tissue]
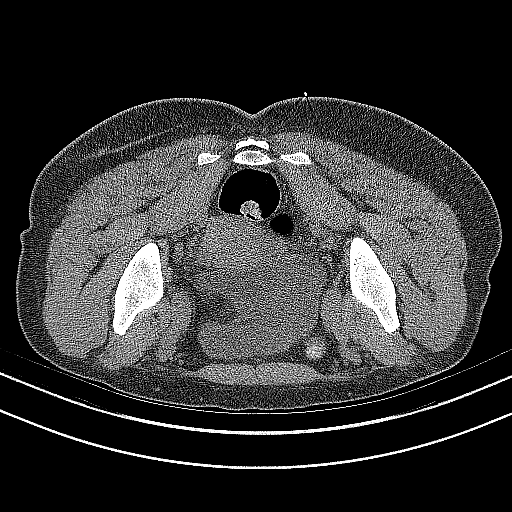
[im 6/51  bone]
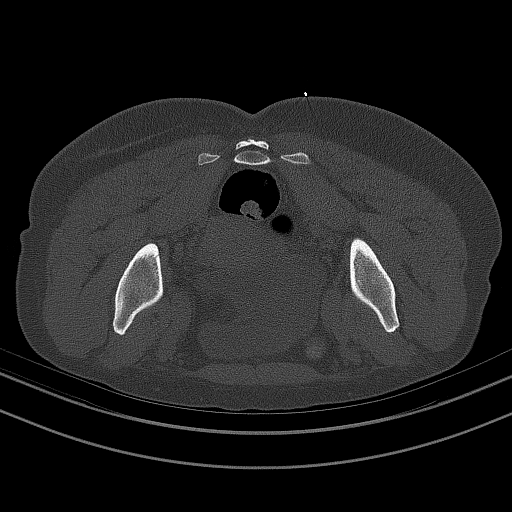
[im 11/51  soft-tissue]
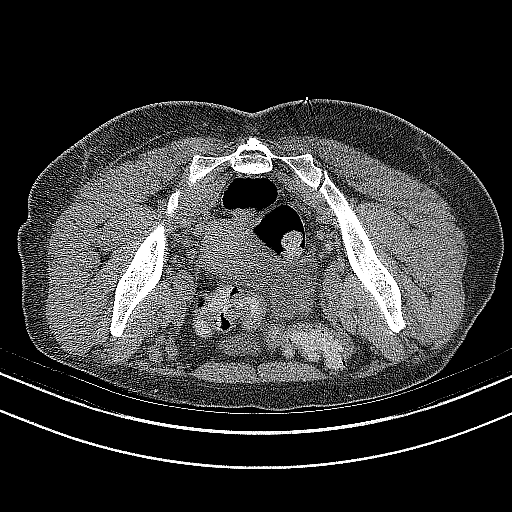
[im 16/51  soft-tissue]
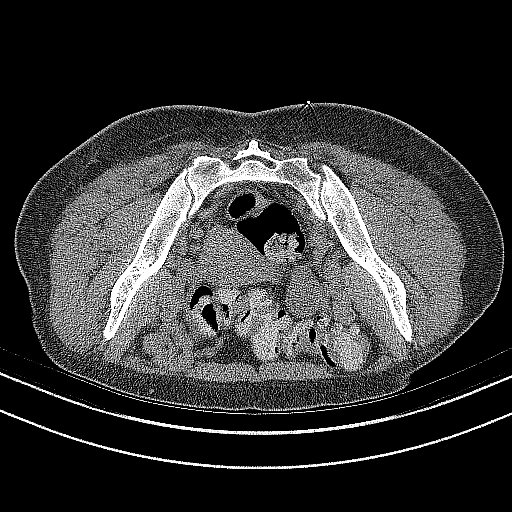
[im 21/51  soft-tissue]
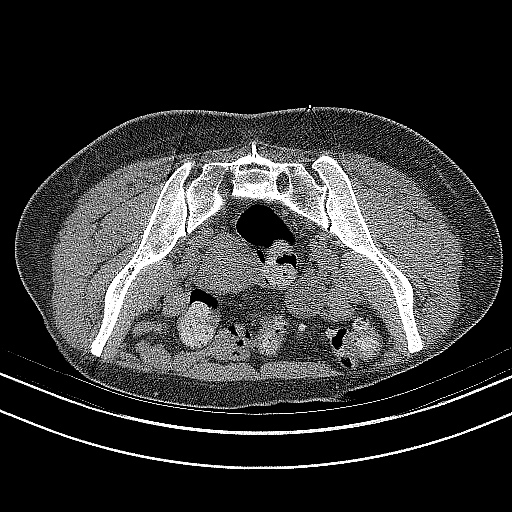
[im 26/51  soft-tissue]
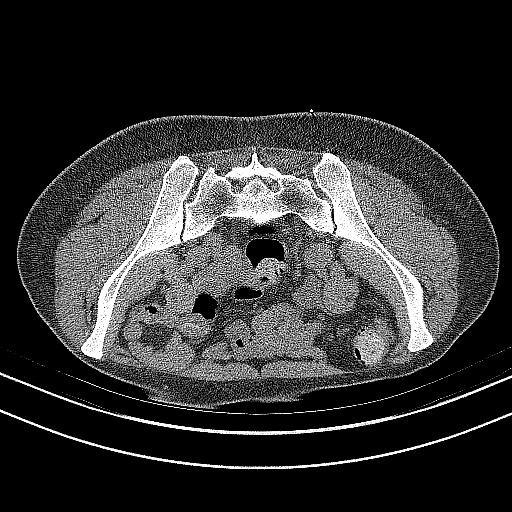
[im 31/51  soft-tissue]
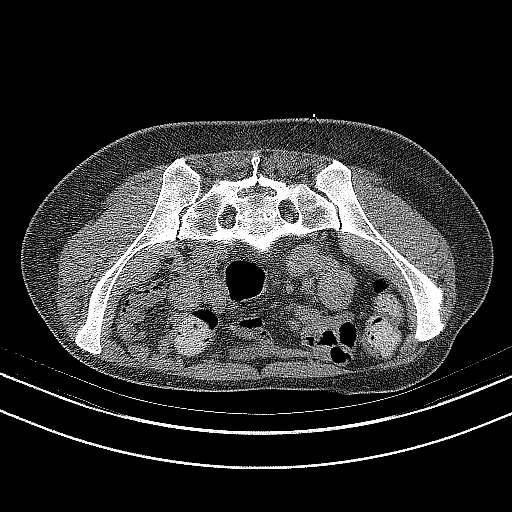
[im 31/51  lung]
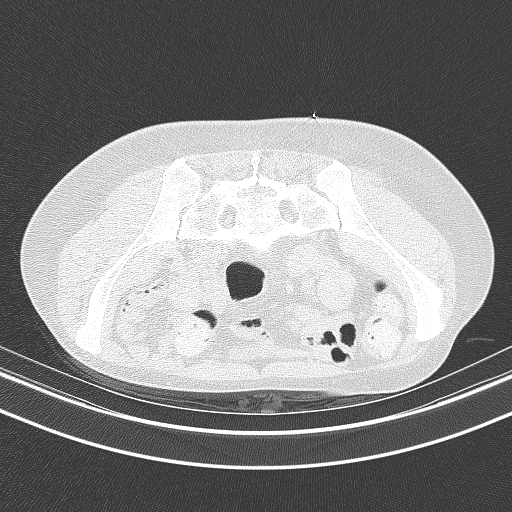
[im 36/51  soft-tissue]
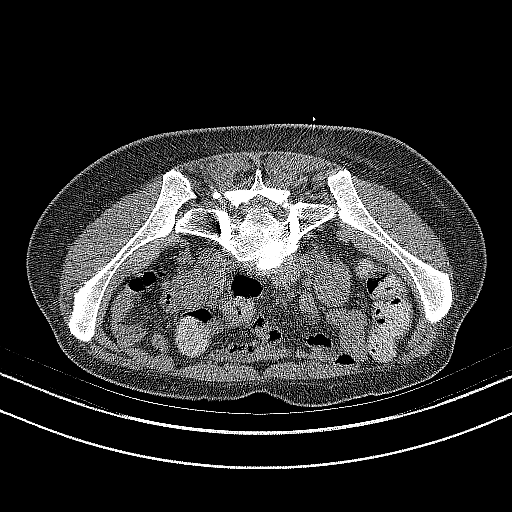
[im 36/51  lung]
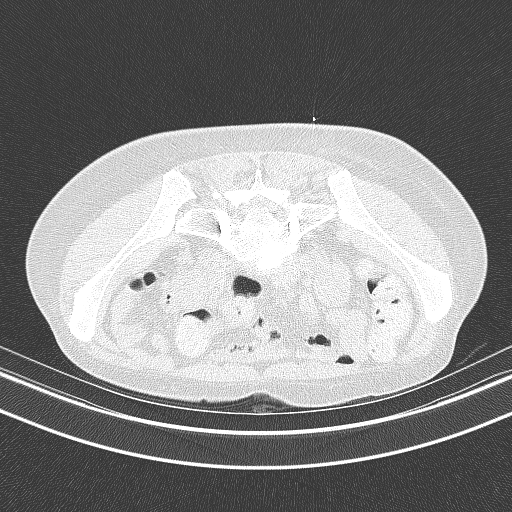
[im 41/51  soft-tissue]
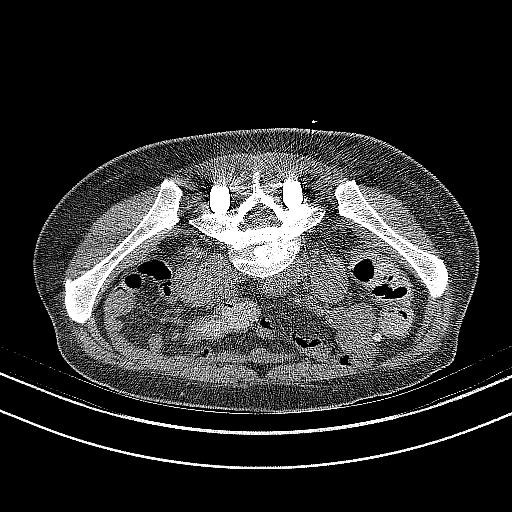
[im 41/51  lung]
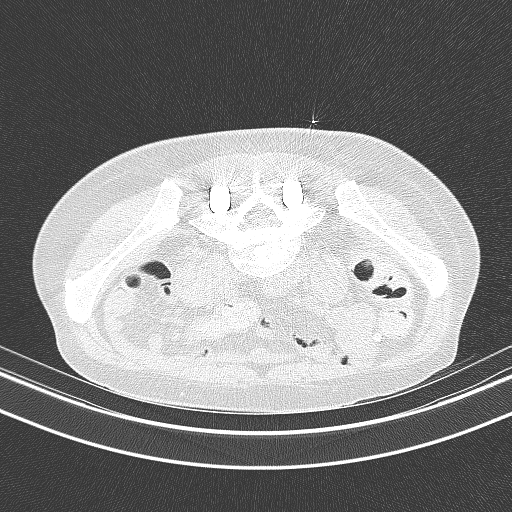
[im 46/51  soft-tissue]
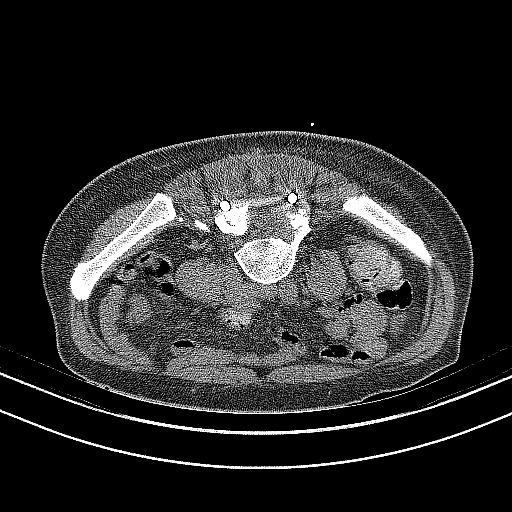
[im 46/51  lung]
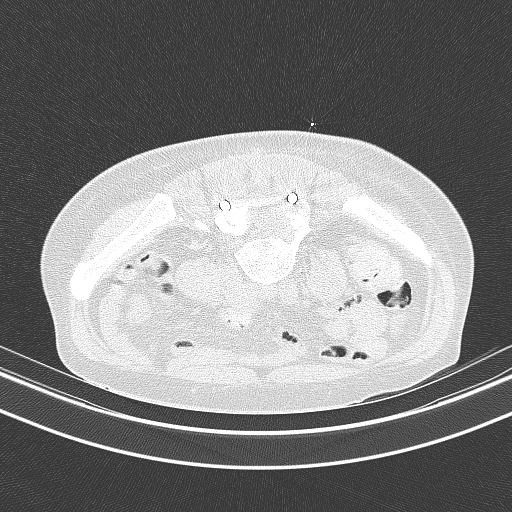
[im 46/51  bone]
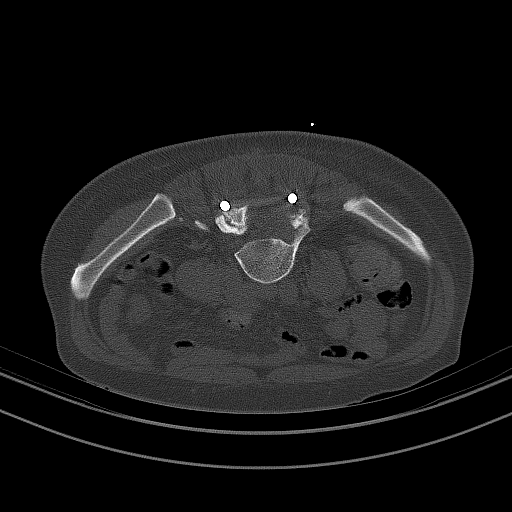

[9 of 32 positions shown; findings below may reference images not displayed]

EXAM:
CT-GUIDED LEFT SACROILIAC JOINT INJECTION.

PROCEDURE:
After a thorough discussion of risks and benefits of the procedure,
including bleeding, infection, injury to nerves, blood vessels, and
adjacent structures, verbal and written consent was obtained. The
patient was placed prone on the CT table and localization was
performed over the sacrum. Target site was marked using CT guidance.
The skin was prepped and draped in the usual sterile fashion using
Betadine soap.

After local anesthesia with 1% lidocaine without epinephrine and
subsequent deep anesthesia, a 3.5 inch 22 gauge spinal needle was
advanced into the left SI joint. Injection of 0.5 ml Isovue-M 200
confirmed intra-articular placement. No vascular uptake present.
Subsequently, 120 mg of Depo-Medrol mixed with 0.5 mL of 0.5%
bupivacaine were injected into the left SI joint. Needle was removed
and a sterile dressing applied.

No complications were observed. The patient was observed and
released under the care of a driver after 30 minutes.
IMPRESSION: Successful CT guided left SI joint injection with steroid and
anesthetic.
